# Patient Record
Sex: Female | Born: 1965 | Race: White | Hispanic: No | Marital: Married | Smoking: Former smoker
Health system: Southern US, Community
[De-identification: ages and names within clinical notes are randomized; demographics above are authoritative.]

## PROBLEM LIST (undated history)

## (undated) DIAGNOSIS — M1990 Unspecified osteoarthritis, unspecified site: Secondary | ICD-10-CM

## (undated) DIAGNOSIS — E785 Hyperlipidemia, unspecified: Secondary | ICD-10-CM

## (undated) DIAGNOSIS — T7840XA Allergy, unspecified, initial encounter: Secondary | ICD-10-CM

## (undated) DIAGNOSIS — I8390 Asymptomatic varicose veins of unspecified lower extremity: Secondary | ICD-10-CM

## (undated) DIAGNOSIS — K589 Irritable bowel syndrome without diarrhea: Secondary | ICD-10-CM

## (undated) DIAGNOSIS — D649 Anemia, unspecified: Secondary | ICD-10-CM

## (undated) DIAGNOSIS — K635 Polyp of colon: Secondary | ICD-10-CM

## (undated) DIAGNOSIS — B159 Hepatitis A without hepatic coma: Secondary | ICD-10-CM

## (undated) DIAGNOSIS — K219 Gastro-esophageal reflux disease without esophagitis: Secondary | ICD-10-CM

## (undated) DIAGNOSIS — K8020 Calculus of gallbladder without cholecystitis without obstruction: Secondary | ICD-10-CM

## (undated) DIAGNOSIS — K5900 Constipation, unspecified: Secondary | ICD-10-CM

## (undated) HISTORY — DX: Gastro-esophageal reflux disease without esophagitis: K21.9

## (undated) HISTORY — DX: Polyp of colon: K63.5

## (undated) HISTORY — DX: Calculus of gallbladder without cholecystitis without obstruction: K80.20

## (undated) HISTORY — PX: POLYPECTOMY: SHX149

## (undated) HISTORY — PX: BREAST LUMPECTOMY: SHX2

## (undated) HISTORY — DX: Unspecified osteoarthritis, unspecified site: M19.90

## (undated) HISTORY — DX: Irritable bowel syndrome, unspecified: K58.9

## (undated) HISTORY — PX: HYSTEROSCOPY: SHX211

## (undated) HISTORY — DX: Constipation, unspecified: K59.00

## (undated) HISTORY — DX: Hepatitis a without hepatic coma: B15.9

## (undated) HISTORY — DX: Hyperlipidemia, unspecified: E78.5

## (undated) HISTORY — PX: BREAST EXCISIONAL BIOPSY: SUR124

## (undated) HISTORY — DX: Allergy, unspecified, initial encounter: T78.40XA

## (undated) HISTORY — PX: CHOLECYSTECTOMY: SHX55

## (undated) HISTORY — PX: COLONOSCOPY: SHX174

## (undated) HISTORY — DX: Asymptomatic varicose veins of unspecified lower extremity: I83.90

## (undated) HISTORY — DX: Anemia, unspecified: D64.9

## (undated) HISTORY — PX: WISDOM TOOTH EXTRACTION: SHX21

## (undated) HISTORY — PX: APPENDECTOMY: SHX54

---

## 2012-12-15 HISTORY — PX: ESOPHAGOGASTRODUODENOSCOPY: SHX1529

## 2015-12-29 HISTORY — PX: BREAST CYST ASPIRATION: SHX578

## 2016-06-23 ENCOUNTER — Other Ambulatory Visit: Payer: Self-pay | Admitting: Obstetrics and Gynecology

## 2016-06-23 DIAGNOSIS — Z1231 Encounter for screening mammogram for malignant neoplasm of breast: Secondary | ICD-10-CM

## 2016-07-10 ENCOUNTER — Ambulatory Visit
Admission: RE | Admit: 2016-07-10 | Discharge: 2016-07-10 | Disposition: A | Discharge status: Home / Self Care | Payer: PRIVATE HEALTH INSURANCE | Source: Ambulatory Visit | Attending: Obstetrics and Gynecology | Admitting: Obstetrics and Gynecology

## 2016-07-10 DIAGNOSIS — Z1231 Encounter for screening mammogram for malignant neoplasm of breast: Secondary | ICD-10-CM

## 2016-07-14 ENCOUNTER — Other Ambulatory Visit: Payer: Self-pay | Admitting: Obstetrics and Gynecology

## 2016-07-14 DIAGNOSIS — R928 Other abnormal and inconclusive findings on diagnostic imaging of breast: Secondary | ICD-10-CM

## 2016-07-24 ENCOUNTER — Other Ambulatory Visit: Payer: Self-pay | Admitting: Obstetrics and Gynecology

## 2016-07-24 ENCOUNTER — Ambulatory Visit
Admission: RE | Admit: 2016-07-24 | Discharge: 2016-07-24 | Disposition: A | Discharge status: Home / Self Care | Payer: PRIVATE HEALTH INSURANCE | Source: Ambulatory Visit | Attending: Obstetrics and Gynecology | Admitting: Obstetrics and Gynecology

## 2016-07-24 DIAGNOSIS — R928 Other abnormal and inconclusive findings on diagnostic imaging of breast: Secondary | ICD-10-CM

## 2016-07-24 DIAGNOSIS — N6310 Unspecified lump in the right breast, unspecified quadrant: Secondary | ICD-10-CM

## 2016-07-27 ENCOUNTER — Other Ambulatory Visit: Payer: Self-pay | Admitting: Obstetrics and Gynecology

## 2016-07-27 ENCOUNTER — Ambulatory Visit
Admission: RE | Admit: 2016-07-27 | Discharge: 2016-07-27 | Disposition: A | Discharge status: Home / Self Care | Payer: PRIVATE HEALTH INSURANCE | Source: Ambulatory Visit | Attending: Obstetrics and Gynecology | Admitting: Obstetrics and Gynecology

## 2016-07-27 DIAGNOSIS — N6310 Unspecified lump in the right breast, unspecified quadrant: Secondary | ICD-10-CM

## 2017-06-22 ENCOUNTER — Other Ambulatory Visit: Payer: Self-pay | Admitting: Obstetrics and Gynecology

## 2017-06-22 DIAGNOSIS — Z1231 Encounter for screening mammogram for malignant neoplasm of breast: Secondary | ICD-10-CM

## 2017-07-16 ENCOUNTER — Ambulatory Visit
Admission: RE | Admit: 2017-07-16 | Discharge: 2017-07-16 | Disposition: A | Discharge status: Home / Self Care | Payer: PRIVATE HEALTH INSURANCE | Source: Ambulatory Visit | Attending: Obstetrics and Gynecology | Admitting: Obstetrics and Gynecology

## 2017-07-16 ENCOUNTER — Encounter (INDEPENDENT_AMBULATORY_CARE_PROVIDER_SITE_OTHER): Payer: Self-pay

## 2017-07-16 DIAGNOSIS — Z1231 Encounter for screening mammogram for malignant neoplasm of breast: Secondary | ICD-10-CM

## 2018-06-17 ENCOUNTER — Other Ambulatory Visit: Payer: Self-pay | Admitting: Obstetrics and Gynecology

## 2018-06-17 DIAGNOSIS — Z1231 Encounter for screening mammogram for malignant neoplasm of breast: Secondary | ICD-10-CM

## 2018-07-22 ENCOUNTER — Ambulatory Visit: Payer: PRIVATE HEALTH INSURANCE

## 2018-08-12 ENCOUNTER — Ambulatory Visit
Admission: RE | Admit: 2018-08-12 | Discharge: 2018-08-12 | Disposition: A | Discharge status: Home / Self Care | Payer: PRIVATE HEALTH INSURANCE | Source: Ambulatory Visit | Attending: Obstetrics and Gynecology | Admitting: Obstetrics and Gynecology

## 2018-08-12 DIAGNOSIS — Z1231 Encounter for screening mammogram for malignant neoplasm of breast: Secondary | ICD-10-CM

## 2018-12-20 ENCOUNTER — Encounter: Payer: Self-pay | Admitting: Gastroenterology

## 2019-07-21 ENCOUNTER — Other Ambulatory Visit: Payer: Self-pay | Admitting: Obstetrics and Gynecology

## 2019-07-21 DIAGNOSIS — Z1231 Encounter for screening mammogram for malignant neoplasm of breast: Secondary | ICD-10-CM

## 2019-08-10 ENCOUNTER — Ambulatory Visit (AMBULATORY_SURGERY_CENTER): Payer: Self-pay | Admitting: *Deleted

## 2019-08-10 ENCOUNTER — Other Ambulatory Visit: Payer: Self-pay

## 2019-08-10 VITALS — Temp 97.1°F | Ht 69.0 in | Wt 242.0 lb

## 2019-08-10 DIAGNOSIS — Z8371 Family history of colonic polyps: Secondary | ICD-10-CM

## 2019-08-10 MED ORDER — SUPREP BOWEL PREP KIT 17.5-3.13-1.6 GM/177ML PO SOLN: 1.0000 | Freq: Once | ORAL | 0 refills | Status: AC

## 2019-08-10 NOTE — Progress Notes (Signed)
No egg or soy allergy known to patient  No issues with past sedation with any surgeries  or procedures, no intubation problems  No diet pills per patient No home 02 use per patient  No blood thinners per patient  Pt states  issues with constipation- is better- uses Miralax prn- has aBM  Every other day- no hard like used to be but always has a fair prep  - drinks a lot of water - Off Amitiza- WILL DO A 2 DAY PREP  No A fib or A flutter  EMMI video sent to pt's e mail  Pt states Dr Lyndel Safe always tells her she  Has a fair prep

## 2019-08-18 ENCOUNTER — Encounter: Payer: Self-pay | Admitting: Gastroenterology

## 2019-08-21 ENCOUNTER — Telehealth: Payer: Self-pay

## 2019-08-21 NOTE — Telephone Encounter (Signed)
Covid-19 screening questions   Do you now or have you had a fever in the last 14 days? NO   Do you have any respiratory symptoms of shortness of breath or cough now or in the last 14 days? NO  Do you have any family members or close contacts with diagnosed or suspected Covid-19 in the past 14 days? NO  Have you been tested for Covid-19 and found to be positive? NO        

## 2019-08-22 ENCOUNTER — Encounter: Payer: Self-pay | Admitting: Gastroenterology

## 2019-08-22 ENCOUNTER — Other Ambulatory Visit: Payer: Self-pay

## 2019-08-22 ENCOUNTER — Ambulatory Visit (AMBULATORY_SURGERY_CENTER): Payer: 59 | Admitting: Gastroenterology

## 2019-08-22 VITALS — BP 102/77 | HR 69 | Temp 98.3°F | Resp 14 | Ht 69.0 in | Wt 242.0 lb

## 2019-08-22 DIAGNOSIS — D122 Benign neoplasm of ascending colon: Secondary | ICD-10-CM | POA: Diagnosis not present

## 2019-08-22 DIAGNOSIS — Z1211 Encounter for screening for malignant neoplasm of colon: Secondary | ICD-10-CM | POA: Diagnosis not present

## 2019-08-22 DIAGNOSIS — Z8371 Family history of colonic polyps: Secondary | ICD-10-CM

## 2019-08-22 MED ORDER — SODIUM CHLORIDE 0.9 % IV SOLN: 500.0000 mL | Freq: Once | INTRAVENOUS | Status: DC

## 2019-08-22 NOTE — Progress Notes (Signed)
To PACU, VSS. Report to [email or web address] 

## 2019-08-22 NOTE — Op Note (Signed)
[facility] Patient Name: Erica Stephens Procedure Date: 08/22/2019 9:13 AM MRN: [id number] Endoscopist: Jackquline Denmark , MD Age: 53 Referring MD:  Date of Birth: 11-21-1966 Gender: Female Account #: [id number] Procedure:                Colonoscopy Indications:              Screening for colorectal malignant neoplasm (high                            risk). Family history of colonic polyps (mom).                            Family history of colon cancer in a second-degree                            relative (grand mother) Medicines:                Monitored Anesthesia Care Procedure:                Pre-Anesthesia Assessment:                           - Prior to the procedure, a History and Physical                            was performed, and patient medications and                            allergies were reviewed. The patient's tolerance of                            previous anesthesia was also reviewed. The risks                            and benefits of the procedure and the sedation                            options and risks were discussed with the patient.                            All questions were answered, and informed consent                            was obtained. Prior Anticoagulants: The patient has                            taken no previous anticoagulant or antiplatelet                            agents. ASA Grade Assessment: I - A normal, healthy                            patient. After reviewing the risks and benefits,  the patient was deemed in satisfactory condition to                            undergo the procedure.                           After obtaining informed consent, the colonoscope                            was passed under direct vision. Throughout the                            procedure, the patient's blood pressure, pulse, and                            oxygen saturations were monitored continuously. The                           Colonoscope was introduced through the anus and                            advanced to the 2 cm into the ileum. The                            colonoscopy was performed without difficulty. The                            patient tolerated the procedure well. The quality                            of the bowel preparation was excellent. The                            terminal ileum, ileocecal valve, appendiceal                            orifice, and rectum were photographed. Scope In: 9:24:49 AM Scope Out: 9:40:05 AM Scope Withdrawal Time: 0 hours 9 minutes 11 seconds  Total Procedure Duration: 0 hours 15 minutes 16 seconds  Findings:                 A 4 mm polyp was found in the mid ascending colon.                            The polyp was sessile. The polyp was removed with a                            cold biopsy forceps. Resection and retrieval were                            complete. Estimated blood loss: none.                           The terminal ileum appeared normal.  The exam was otherwise without abnormality on                            direct and retroflexion views. Complications:            No immediate complications. Estimated Blood Loss:     Estimated blood loss: none. Impression:               -Small colonic polyp s/p polypectomy.                           -Otherwise normal colonoscopy to TI. Recommendation:           - Patient has a contact number available for                            emergencies. The signs and symptoms of potential                            delayed complications were discussed with the                            patient. Return to normal activities tomorrow.                            Written discharge instructions were provided to the                            patient.                           - Resume previous diet.                           - Continue present medications.                            - Await pathology results.                           - Repeat colonoscopy for surveillance based on                            pathology results.                           - Return to GI clinic PRN.                           - D/w Richard (pt's husband). Jackquline Denmark, MD 08/22/2019 9:48:42 AM This report has been signed electronically.

## 2019-08-22 NOTE — Progress Notes (Signed)
Temps by MB and vitals by CW

## 2019-08-22 NOTE — Patient Instructions (Signed)
Read all handouts given to you by your recovery room nurse.  Thank-you for choosing us for your healthcare needs today.  YOU HAD AN ENDOSCOPIC PROCEDURE TODAY AT THE [facility]:   Refer to the procedure report that was given to you for any specific questions about what was found during the examination.  If the procedure report does not answer your questions, please call your gastroenterologist to clarify.  If you requested that your care partner not be given the details of your procedure findings, then the procedure report has been included in a sealed envelope for you to review at your convenience later.  YOU SHOULD EXPECT: Some feelings of bloating in the abdomen. Passage of more gas than usual.  Walking can help get rid of the air that was put into your GI tract during the procedure and reduce the bloating. If you had a lower endoscopy (such as a colonoscopy or flexible sigmoidoscopy) you may notice spotting of blood in your stool or on the toilet paper. If you underwent a bowel prep for your procedure, you may not have a normal bowel movement for a few days.  Please Note:  You might notice some irritation and congestion in your nose or some drainage.  This is from the oxygen used during your procedure.  There is no need for concern and it should clear up in a day or so.  SYMPTOMS TO REPORT IMMEDIATELY:   Following lower endoscopy (colonoscopy or flexible sigmoidoscopy):  Excessive amounts of blood in the stool  Significant tenderness or worsening of abdominal pains  Swelling of the abdomen that is new, acute  Fever of 100F or higher   For urgent or emergent issues, a gastroenterologist can be reached at any hour by calling [phone].   DIET:  We do recommend a small meal at first, but then you may proceed to your regular diet.  Drink plenty of fluids but you should avoid alcoholic beverages for 24 hours.  ACTIVITY:  You should plan to take it easy for the rest of today  and you should NOT DRIVE or use heavy machinery until tomorrow (because of the sedation medicines used during the test).    FOLLOW UP: Our staff will call the number listed on your records 48-72 hours following your procedure to check on you and address any questions or concerns that you may have regarding the information given to you following your procedure. If we do not reach you, we will leave a message.  We will attempt to reach you two times.  During this call, we will ask if you have developed any symptoms of COVID 19. If you develop any symptoms (ie: fever, flu-like symptoms, shortness of breath, cough etc.) before then, please call [phone].  If you test positive for Covid 19 in the 2 weeks post procedure, please call and report this information to us.    If any biopsies were taken you will be contacted by phone or by letter within the next 1-3 weeks.  Please call us at [phone] if you have not heard about the biopsies in 3 weeks.    SIGNATURES/CONFIDENTIALITY: You and/or your care partner have signed paperwork which will be entered into your electronic medical record.  These signatures attest to the fact that that the information above on your After Visit Summary has been reviewed and is understood.  Full responsibility of the confidentiality of this discharge information lies with you and/or your care-partner. 

## 2019-08-24 ENCOUNTER — Telehealth: Payer: Self-pay

## 2019-08-24 NOTE — Telephone Encounter (Signed)
  Follow up Call-  Call back number 08/22/2019  Post procedure Call Back phone  # [phone]  Permission to leave phone message Yes  Some recent data might be hidden     Patient questions:  Do you have a fever, pain , or abdominal swelling? No. Pain Score  0 *  Have you tolerated food without any problems? Yes.    Have you been able to return to your normal activities? Yes.    Do you have any questions about your discharge instructions: Diet   No. Medications  No. Follow up visit  No.  Do you have questions or concerns about your Care? No.  Actions: * If pain score is 4 or above: No action needed, pain <4. 1. Have you developed a fever since your procedure? no  2.   Have you had an respiratory symptoms (SOB or cough) since your procedure? no  3.   Have you tested positive for COVID 19 since your procedure no  4.   Have you had any family members/close contacts diagnosed with the COVID 19 since your procedure?  no   If yes to any of these questions please route to Joylene John, RN and Alphonsa Gin, Therapist, sports.

## 2019-08-27 ENCOUNTER — Encounter: Payer: Self-pay | Admitting: Gastroenterology

## 2019-09-08 ENCOUNTER — Other Ambulatory Visit: Payer: Self-pay

## 2019-09-08 ENCOUNTER — Ambulatory Visit
Admission: RE | Admit: 2019-09-08 | Discharge: 2019-09-08 | Disposition: A | Discharge status: Home / Self Care | Payer: PRIVATE HEALTH INSURANCE | Source: Ambulatory Visit | Attending: Obstetrics and Gynecology | Admitting: Obstetrics and Gynecology

## 2019-09-08 DIAGNOSIS — Z1231 Encounter for screening mammogram for malignant neoplasm of breast: Secondary | ICD-10-CM

## 2020-07-03 ENCOUNTER — Other Ambulatory Visit: Payer: Self-pay | Admitting: Obstetrics and Gynecology

## 2020-07-03 DIAGNOSIS — Z1231 Encounter for screening mammogram for malignant neoplasm of breast: Secondary | ICD-10-CM

## 2020-09-13 ENCOUNTER — Other Ambulatory Visit: Payer: Self-pay

## 2020-09-13 ENCOUNTER — Ambulatory Visit
Admission: RE | Admit: 2020-09-13 | Discharge: 2020-09-13 | Disposition: A | Discharge status: Home / Self Care | Payer: 59 | Source: Ambulatory Visit | Attending: Obstetrics and Gynecology | Admitting: Obstetrics and Gynecology

## 2020-09-13 DIAGNOSIS — Z1231 Encounter for screening mammogram for malignant neoplasm of breast: Secondary | ICD-10-CM

## 2020-09-17 ENCOUNTER — Other Ambulatory Visit: Payer: Self-pay | Admitting: Obstetrics and Gynecology

## 2020-09-17 DIAGNOSIS — R928 Other abnormal and inconclusive findings on diagnostic imaging of breast: Secondary | ICD-10-CM

## 2020-09-23 ENCOUNTER — Other Ambulatory Visit: Payer: Self-pay

## 2020-09-23 ENCOUNTER — Ambulatory Visit
Admission: RE | Admit: 2020-09-23 | Discharge: 2020-09-23 | Disposition: A | Discharge status: Home / Self Care | Payer: 59 | Source: Ambulatory Visit | Attending: Obstetrics and Gynecology | Admitting: Obstetrics and Gynecology

## 2020-09-23 ENCOUNTER — Other Ambulatory Visit: Payer: Self-pay | Admitting: Obstetrics and Gynecology

## 2020-09-23 DIAGNOSIS — R921 Mammographic calcification found on diagnostic imaging of breast: Secondary | ICD-10-CM

## 2020-09-23 DIAGNOSIS — R928 Other abnormal and inconclusive findings on diagnostic imaging of breast: Secondary | ICD-10-CM

## 2020-09-30 ENCOUNTER — Ambulatory Visit
Admission: RE | Admit: 2020-09-30 | Discharge: 2020-09-30 | Disposition: A | Discharge status: Home / Self Care | Payer: 59 | Source: Ambulatory Visit | Attending: Obstetrics and Gynecology | Admitting: Obstetrics and Gynecology

## 2020-09-30 ENCOUNTER — Other Ambulatory Visit: Payer: Self-pay

## 2020-09-30 DIAGNOSIS — R921 Mammographic calcification found on diagnostic imaging of breast: Secondary | ICD-10-CM

## 2020-10-01 ENCOUNTER — Other Ambulatory Visit: Payer: Self-pay | Admitting: Obstetrics and Gynecology

## 2020-10-01 DIAGNOSIS — D241 Benign neoplasm of right breast: Secondary | ICD-10-CM

## 2020-10-11 ENCOUNTER — Ambulatory Visit
Admission: RE | Admit: 2020-10-11 | Discharge: 2020-10-11 | Disposition: A | Discharge status: Home / Self Care | Payer: 59 | Source: Ambulatory Visit | Attending: Obstetrics and Gynecology | Admitting: Obstetrics and Gynecology

## 2020-10-11 ENCOUNTER — Other Ambulatory Visit: Payer: Self-pay

## 2020-10-11 DIAGNOSIS — D241 Benign neoplasm of right breast: Secondary | ICD-10-CM

## 2021-08-06 ENCOUNTER — Other Ambulatory Visit: Payer: Self-pay | Admitting: Obstetrics and Gynecology

## 2021-08-06 DIAGNOSIS — Z1231 Encounter for screening mammogram for malignant neoplasm of breast: Secondary | ICD-10-CM

## 2021-09-26 ENCOUNTER — Ambulatory Visit
Admission: RE | Admit: 2021-09-26 | Discharge: 2021-09-26 | Disposition: A | Discharge status: Home / Self Care | Payer: 59 | Source: Ambulatory Visit | Attending: Obstetrics and Gynecology | Admitting: Obstetrics and Gynecology

## 2021-09-26 ENCOUNTER — Other Ambulatory Visit: Payer: Self-pay

## 2021-09-26 DIAGNOSIS — Z1231 Encounter for screening mammogram for malignant neoplasm of breast: Secondary | ICD-10-CM

## 2021-10-03 ENCOUNTER — Other Ambulatory Visit: Payer: Self-pay | Admitting: Obstetrics and Gynecology

## 2021-10-03 DIAGNOSIS — R928 Other abnormal and inconclusive findings on diagnostic imaging of breast: Secondary | ICD-10-CM

## 2021-10-23 ENCOUNTER — Other Ambulatory Visit: Payer: 59

## 2021-10-24 ENCOUNTER — Other Ambulatory Visit: Payer: Self-pay

## 2021-10-24 ENCOUNTER — Ambulatory Visit
Admission: RE | Admit: 2021-10-24 | Discharge: 2021-10-24 | Disposition: A | Discharge status: Home / Self Care | Payer: 59 | Source: Ambulatory Visit | Attending: Obstetrics and Gynecology | Admitting: Obstetrics and Gynecology

## 2021-10-24 DIAGNOSIS — R928 Other abnormal and inconclusive findings on diagnostic imaging of breast: Secondary | ICD-10-CM

## 2022-08-12 ENCOUNTER — Other Ambulatory Visit: Payer: Self-pay | Admitting: Obstetrics and Gynecology

## 2022-08-12 DIAGNOSIS — Z1231 Encounter for screening mammogram for malignant neoplasm of breast: Secondary | ICD-10-CM

## 2022-10-02 DIAGNOSIS — Z1231 Encounter for screening mammogram for malignant neoplasm of breast: Secondary | ICD-10-CM

## 2022-11-26 ENCOUNTER — Ambulatory Visit
Admission: RE | Admit: 2022-11-26 | Discharge: 2022-11-26 | Disposition: A | Discharge status: Home / Self Care | Payer: 59 | Source: Ambulatory Visit | Attending: Obstetrics and Gynecology | Admitting: Obstetrics and Gynecology

## 2022-11-26 DIAGNOSIS — Z1231 Encounter for screening mammogram for malignant neoplasm of breast: Secondary | ICD-10-CM

## 2022-11-30 ENCOUNTER — Other Ambulatory Visit: Payer: Self-pay | Admitting: Obstetrics and Gynecology

## 2022-11-30 DIAGNOSIS — R928 Other abnormal and inconclusive findings on diagnostic imaging of breast: Secondary | ICD-10-CM

## 2022-12-10 ENCOUNTER — Other Ambulatory Visit: Payer: 59

## 2023-01-01 ENCOUNTER — Ambulatory Visit
Admission: RE | Admit: 2023-01-01 | Discharge: 2023-01-01 | Disposition: A | Discharge status: Home / Self Care | Payer: 59 | Source: Ambulatory Visit | Attending: Obstetrics and Gynecology | Admitting: Obstetrics and Gynecology

## 2023-01-01 DIAGNOSIS — R928 Other abnormal and inconclusive findings on diagnostic imaging of breast: Secondary | ICD-10-CM

## 2023-01-04 ENCOUNTER — Other Ambulatory Visit: Payer: Self-pay | Admitting: Obstetrics and Gynecology

## 2023-01-04 DIAGNOSIS — R928 Other abnormal and inconclusive findings on diagnostic imaging of breast: Secondary | ICD-10-CM

## 2023-01-06 ENCOUNTER — Other Ambulatory Visit: Payer: Self-pay | Admitting: Internal Medicine

## 2023-01-06 DIAGNOSIS — R928 Other abnormal and inconclusive findings on diagnostic imaging of breast: Secondary | ICD-10-CM

## 2023-01-08 ENCOUNTER — Ambulatory Visit
Admission: RE | Admit: 2023-01-08 | Discharge: 2023-01-08 | Disposition: A | Discharge status: Home / Self Care | Payer: 59 | Source: Ambulatory Visit | Attending: Internal Medicine | Admitting: Internal Medicine

## 2023-01-08 ENCOUNTER — Other Ambulatory Visit: Payer: 59

## 2023-01-08 DIAGNOSIS — R928 Other abnormal and inconclusive findings on diagnostic imaging of breast: Secondary | ICD-10-CM

## 2023-01-08 HISTORY — PX: BREAST BIOPSY: SHX20

## 2023-01-19 ENCOUNTER — Other Ambulatory Visit: Payer: Self-pay | Admitting: Surgery

## 2023-01-19 DIAGNOSIS — N6452 Nipple discharge: Secondary | ICD-10-CM

## 2023-02-04 ENCOUNTER — Other Ambulatory Visit: Payer: 59

## 2023-02-23 ENCOUNTER — Ambulatory Visit
Admission: RE | Admit: 2023-02-23 | Discharge: 2023-02-23 | Disposition: A | Discharge status: Home / Self Care | Payer: 59 | Source: Ambulatory Visit | Attending: Surgery | Admitting: Surgery

## 2023-02-23 DIAGNOSIS — N6452 Nipple discharge: Secondary | ICD-10-CM

## 2023-02-23 MED ORDER — GADOPICLENOL 0.5 MMOL/ML IV SOLN
10.0000 mL | Freq: Once | INTRAVENOUS | Status: AC | PRN
  Administered 2023-02-23: 10 mL via INTRAVENOUS

## 2023-02-24 ENCOUNTER — Other Ambulatory Visit: Payer: Self-pay | Admitting: Surgery

## 2023-02-24 DIAGNOSIS — R928 Other abnormal and inconclusive findings on diagnostic imaging of breast: Secondary | ICD-10-CM

## 2023-03-05 ENCOUNTER — Other Ambulatory Visit: Payer: 59

## 2023-03-05 ENCOUNTER — Ambulatory Visit
Admission: RE | Admit: 2023-03-05 | Discharge: 2023-03-05 | Disposition: A | Discharge status: Home / Self Care | Payer: 59 | Source: Ambulatory Visit | Attending: Surgery | Admitting: Surgery

## 2023-03-05 DIAGNOSIS — R928 Other abnormal and inconclusive findings on diagnostic imaging of breast: Secondary | ICD-10-CM

## 2023-03-05 MED ORDER — GADOPICLENOL 0.5 MMOL/ML IV SOLN
10.0000 mL | Freq: Once | INTRAVENOUS | Status: AC | PRN
  Administered 2023-03-05: 10 mL via INTRAVENOUS

## 2023-03-22 ENCOUNTER — Encounter: Payer: Self-pay | Admitting: Gastroenterology

## 2023-04-27 IMAGING — MG MM DIGITAL DIAGNOSTIC UNILAT*R* W/ TOMO W/ CAD
4 series · 4 of 12 positions shown · non-contrast
Comparison: Previous exam(s).

CLINICAL DATA: Screening recall for a possible right breast mass.
Since the screening mammogram from August 2020, a papilloma has
been excised from the lateral right breast.

EXAM:
DIGITAL DIAGNOSTIC UNILATERAL RIGHT MAMMOGRAM WITH TOMOSYNTHESIS AND
CAD; ULTRASOUND RIGHT BREAST LIMITED
TECHNIQUE: Right digital diagnostic mammography and breast tomosynthesis was
performed. The images were evaluated with computer-aided detection.;
Targeted ultrasound examination of the right breast was performed

[R CC synth-2D]
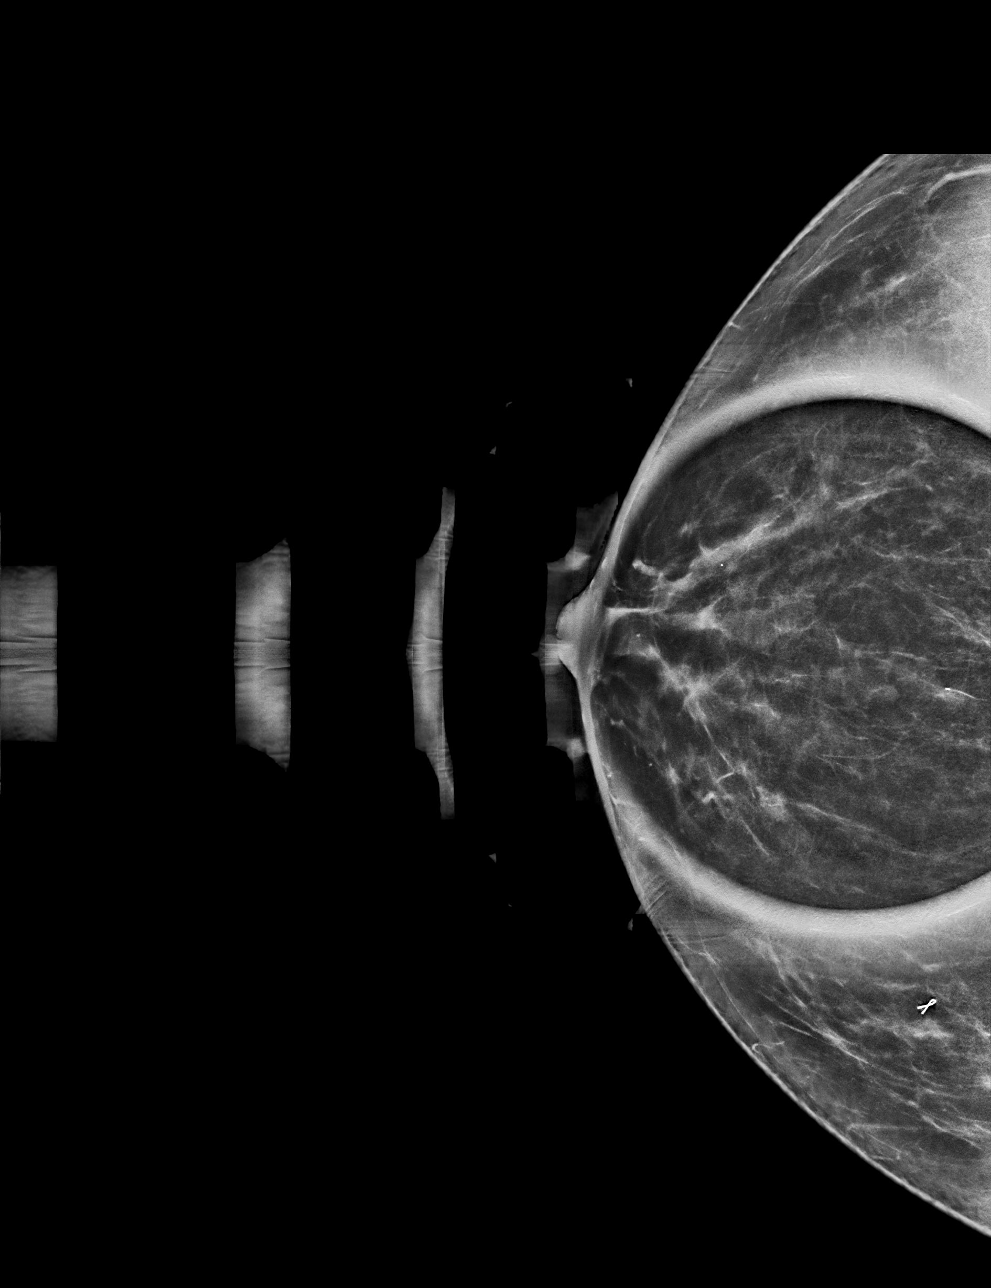

[R MLO synth-2D]
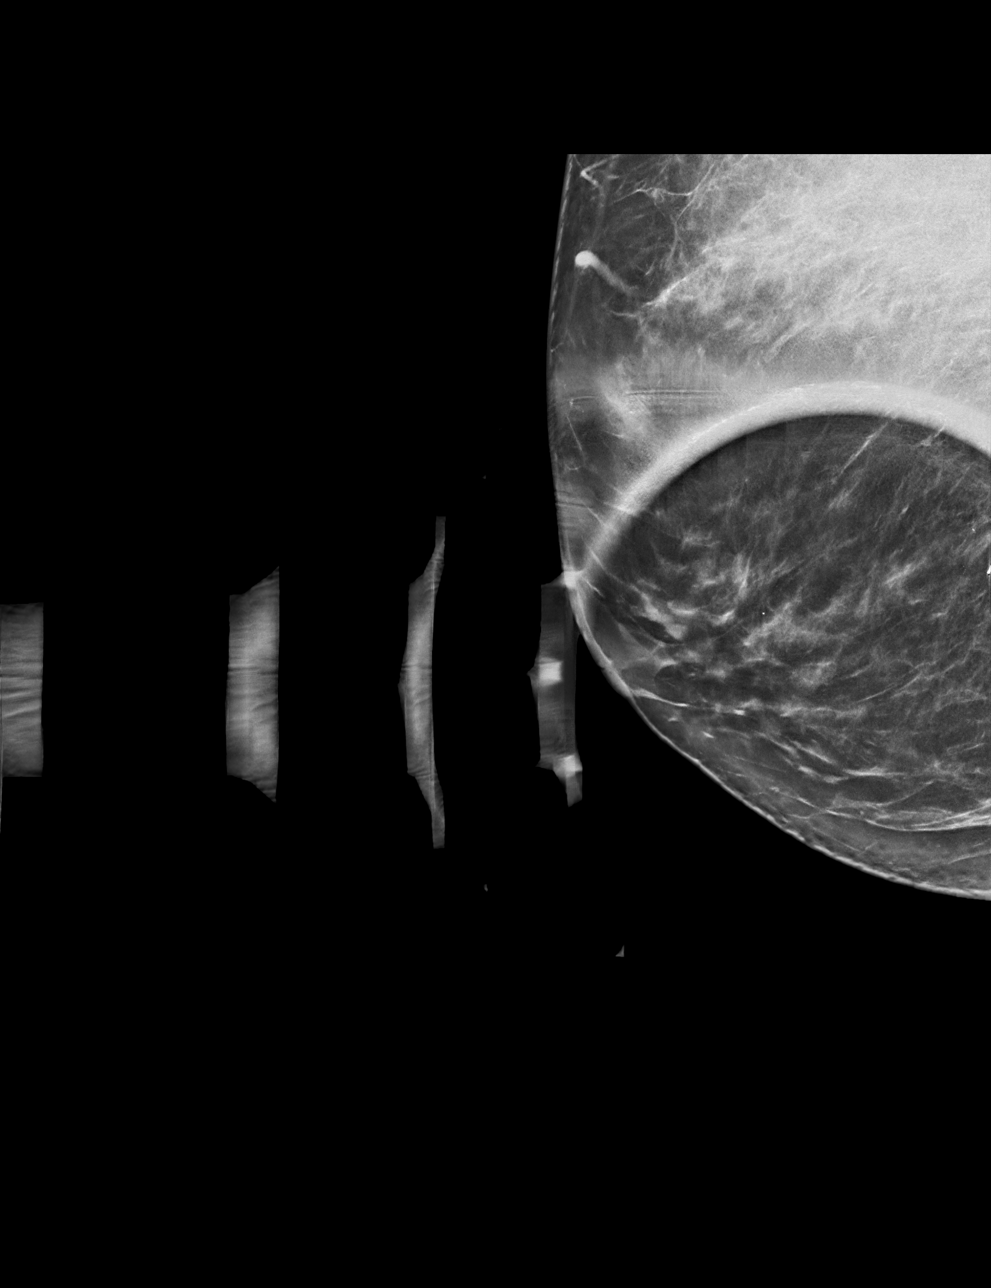

[R MLO tomo · tomo slice 37/73.0]
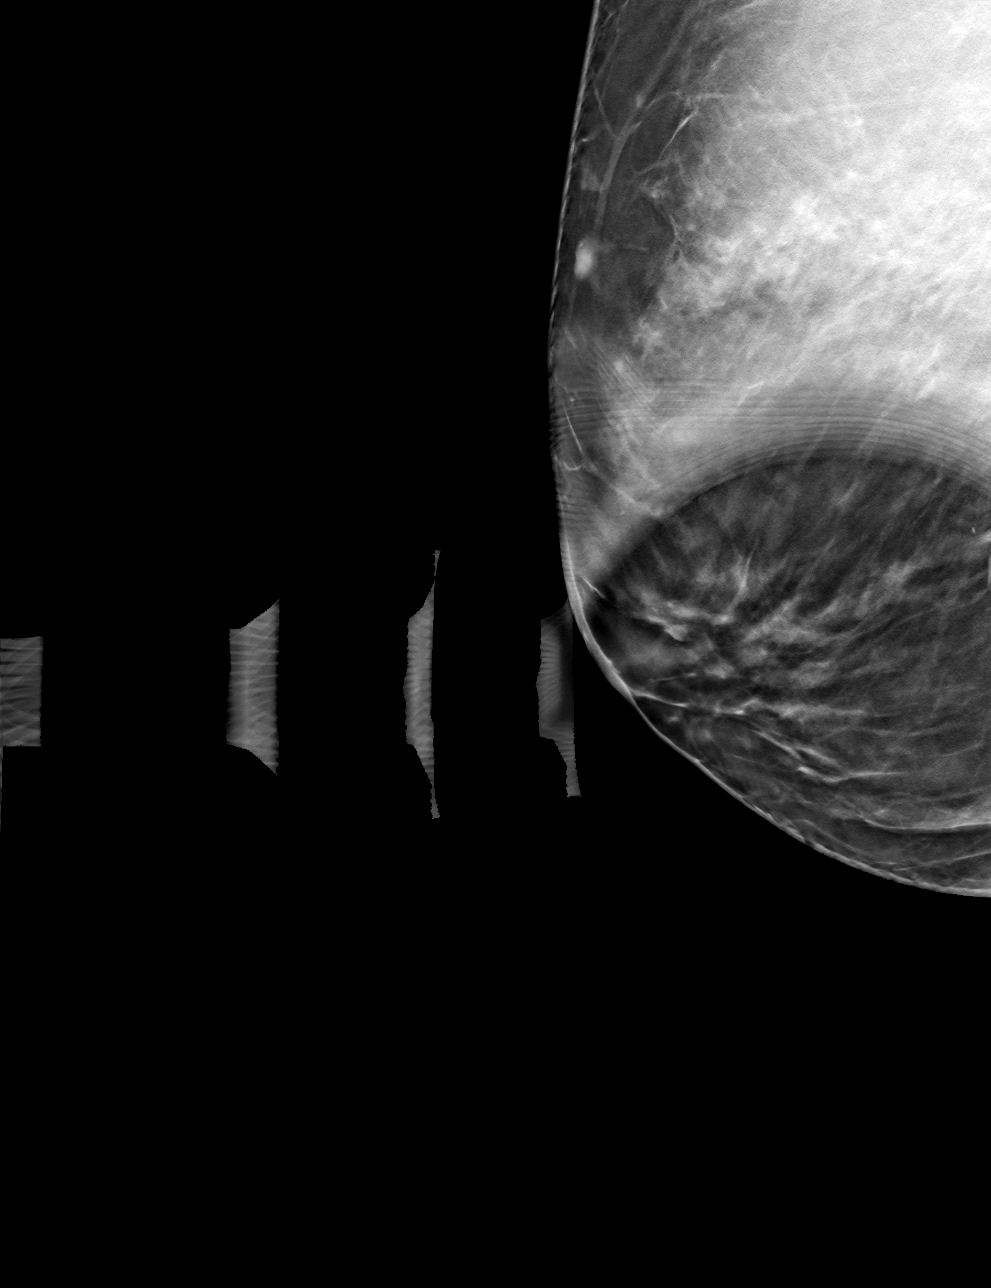

[R CC tomo · tomo slice 35/68.0]
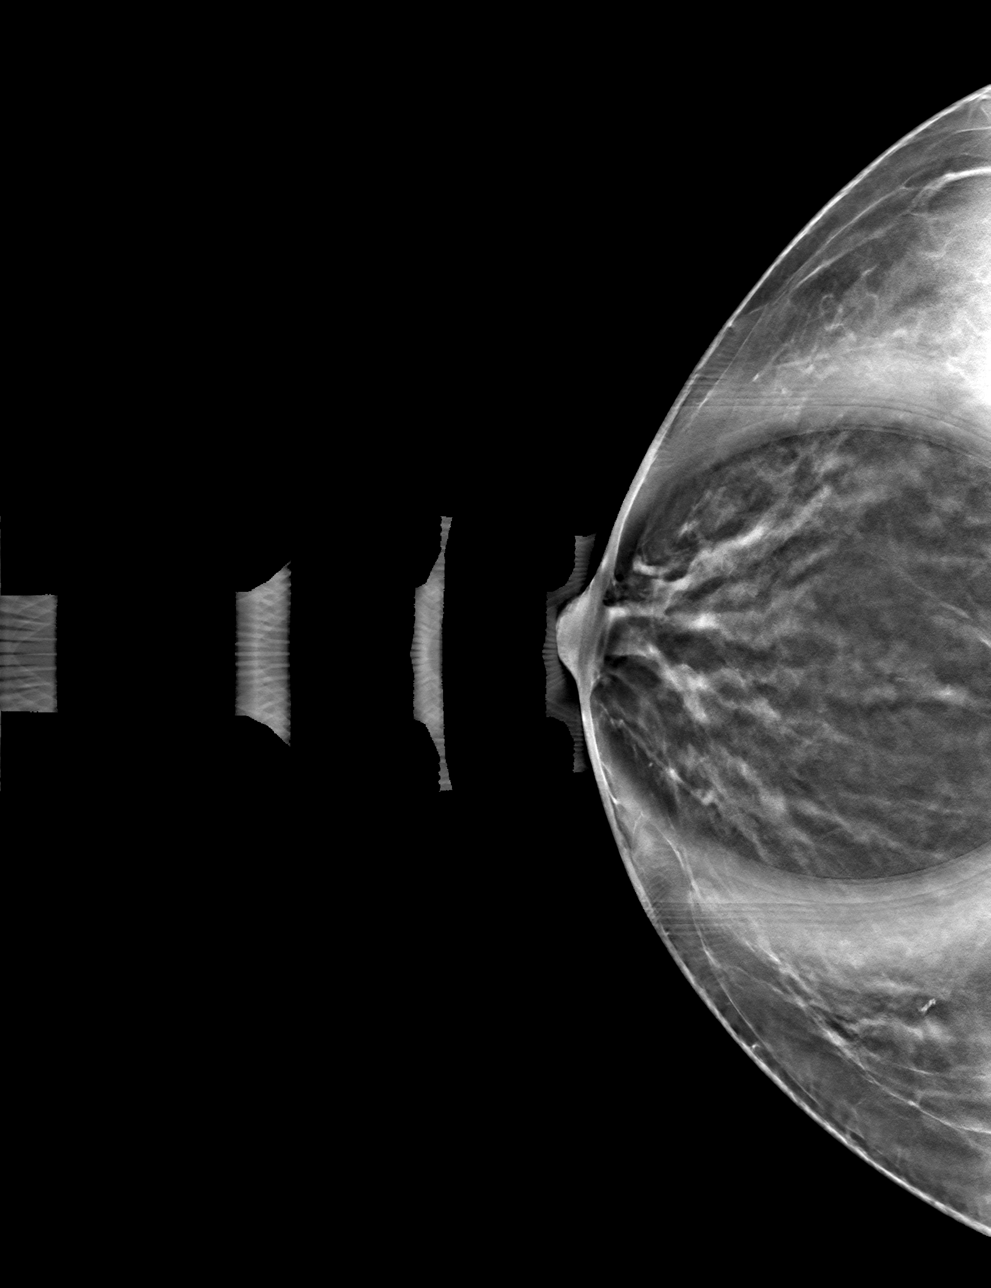

[4 of 12 positions shown; findings below may reference images not displayed]

ACR Breast Density Category b: There are scattered areas of
fibroglandular density.
FINDINGS: The possible mass, noted in the retroareolar breast on the current
screening study, persists as oval to tubular mostly circumscribed
structure in the 6 o'clock retroareolar breast measuring
approximately 9 mm in long axis. There are no other masses and no
areas of nonsurgical architectural distortion. There are no
suspicious calcifications.

Targeted right breast ultrasound is performed, showing a focally
dilated duct in the right breast at 6 o'clock, retroareolar,
measuring 9 x 4 x 5 mm, consistent in size, shape and location to
the mammographic mass. There is no intraductal mass or debris.
IMPRESSION: 1. No evidence of breast malignancy.
2. Benign focally dilated duct in the 6 o'clock retroareolar right
breast. No intraductal mass or tissue/debris.

RECOMMENDATION:
Screening mammogram in one year.(Code:8Y-0-PPP)

I have discussed the findings and recommendations with the patient.
If applicable, a reminder letter will be sent to the patient
regarding the next appointment.

BI-RADS CATEGORY  2: Benign.

## 2023-04-27 IMAGING — US US BREAST*R* LIMITED INC AXILLA
1 series · 8 of 8 positions shown · non-contrast
Comparison: Previous exam(s).

CLINICAL DATA: Screening recall for a possible right breast mass.
Since the screening mammogram from August 2020, a papilloma has
been excised from the lateral right breast.

EXAM:
DIGITAL DIAGNOSTIC UNILATERAL RIGHT MAMMOGRAM WITH TOMOSYNTHESIS AND
CAD; ULTRASOUND RIGHT BREAST LIMITED
TECHNIQUE: Right digital diagnostic mammography and breast tomosynthesis was
performed. The images were evaluated with computer-aided detection.;
Targeted ultrasound examination of the right breast was performed

[Series 1: us breast*right* limited inc axilla · 0.06mm/px · 8 of 8 slices shown]
[im 1/8]
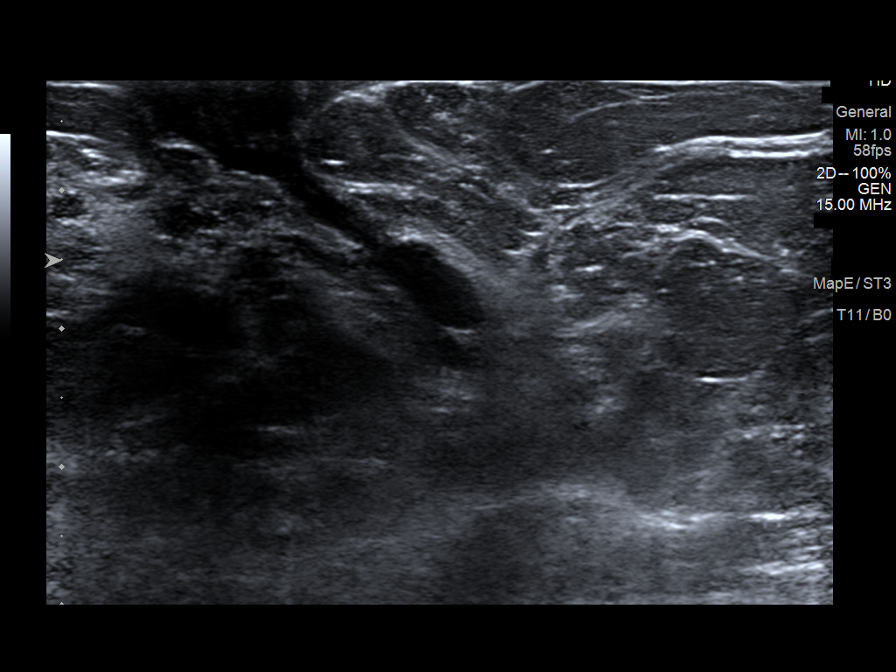
[im 2/8]
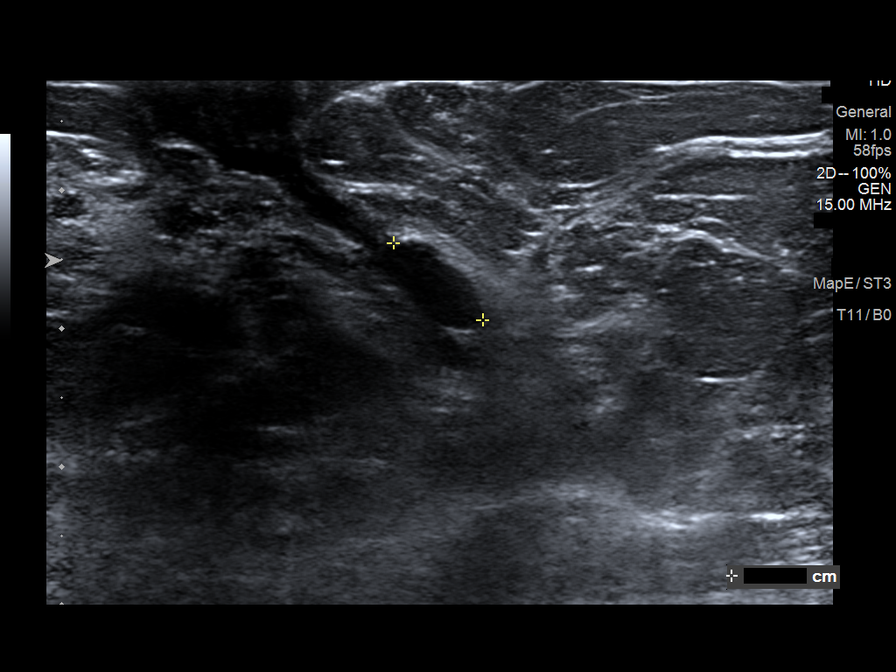
[im 3/8]
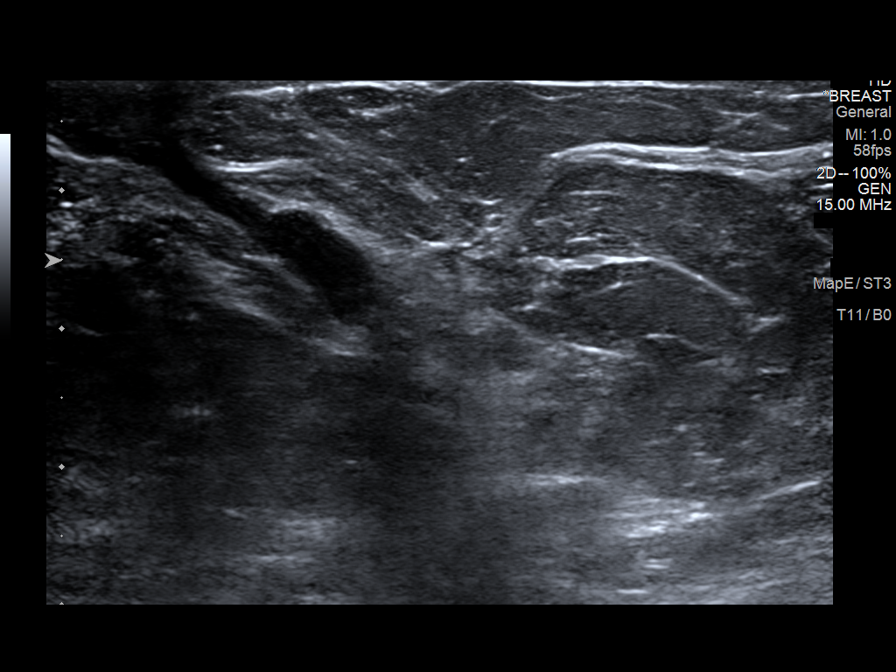
[im 4/8]
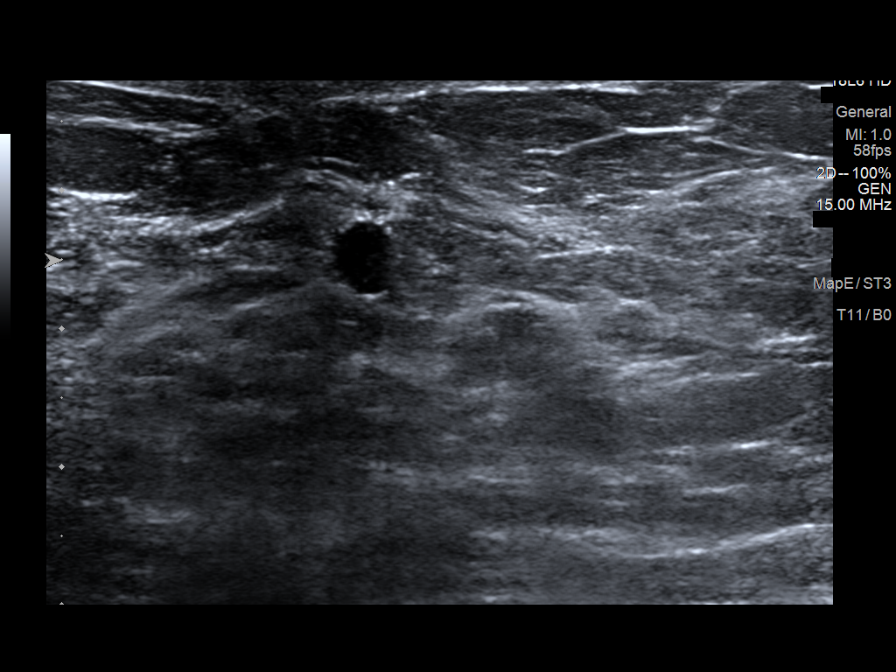
[im 5/8]
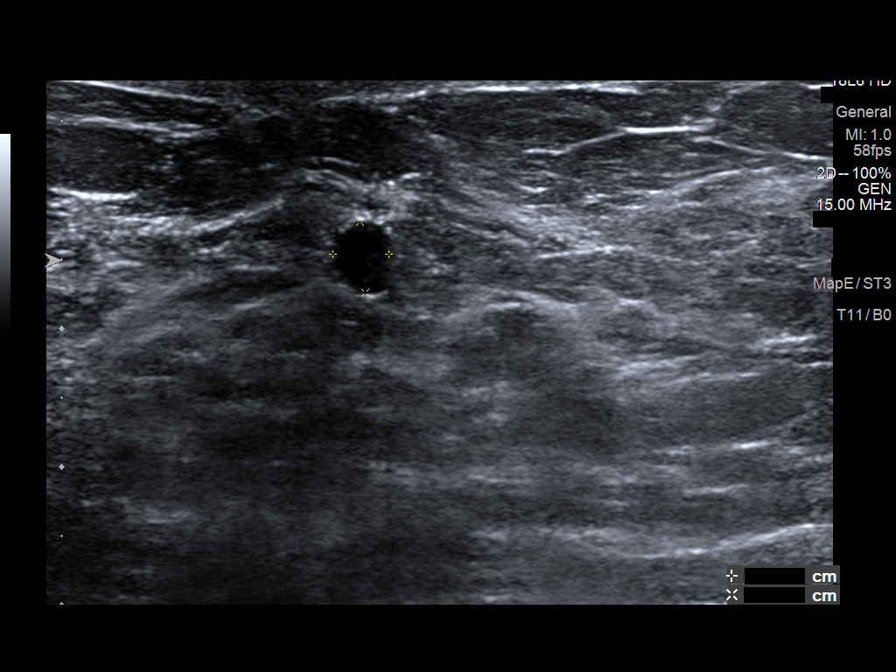
[im 6/8]
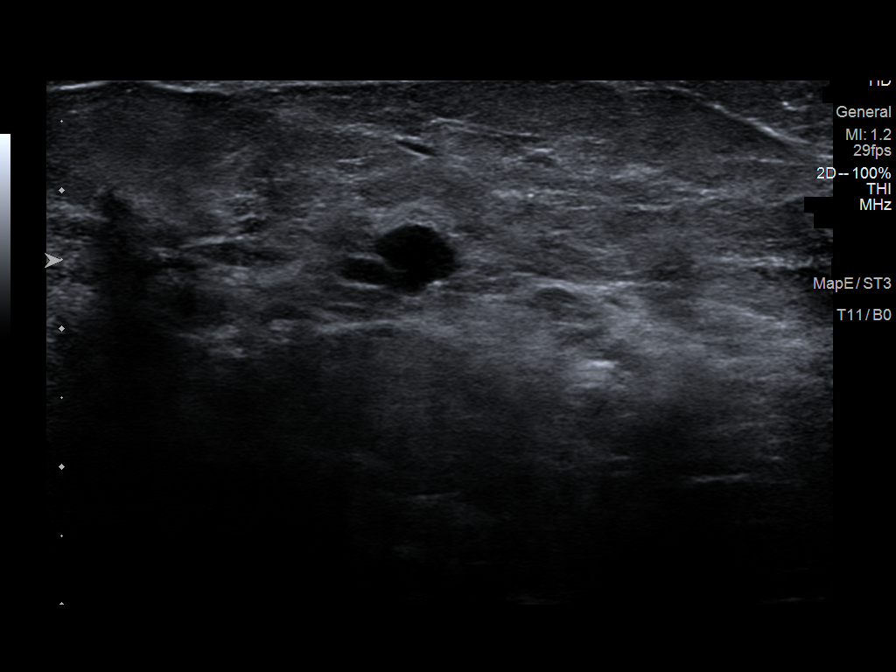
[im 7/8]
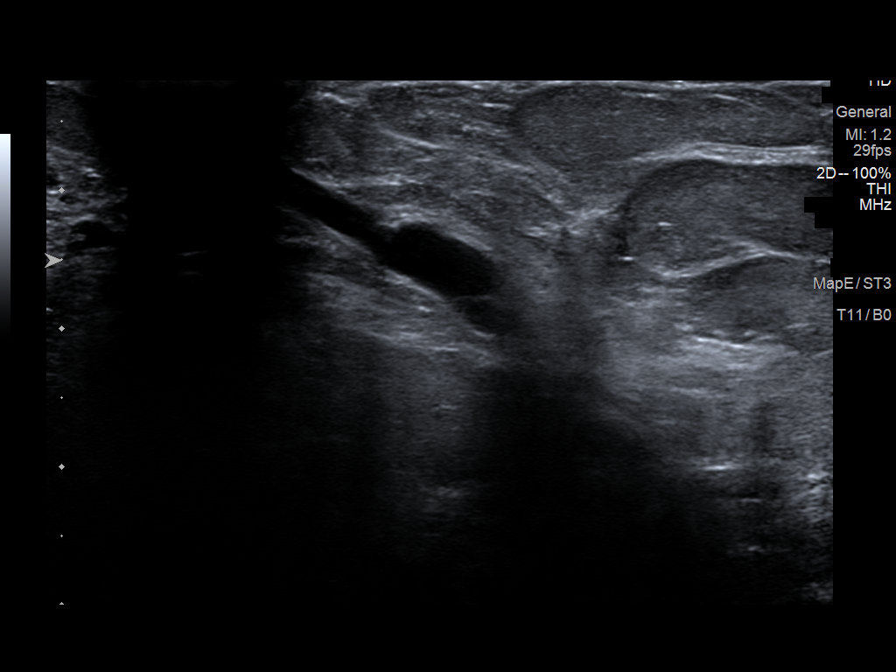
[im 8/8]
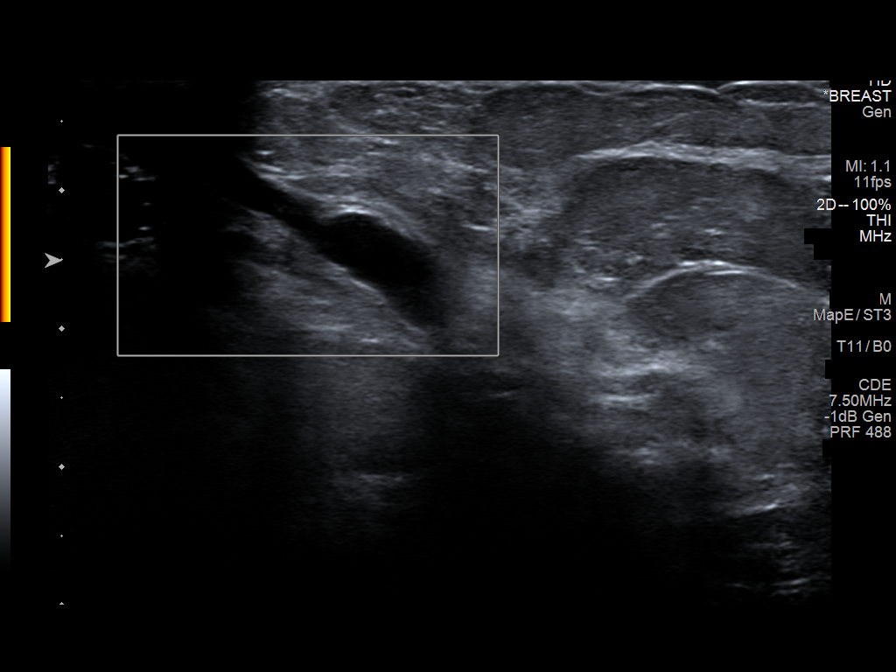

[8 of 8 positions shown; findings below may reference images not displayed]

ACR Breast Density Category b: There are scattered areas of
fibroglandular density.
FINDINGS: The possible mass, noted in the retroareolar breast on the current
screening study, persists as oval to tubular mostly circumscribed
structure in the 6 o'clock retroareolar breast measuring
approximately 9 mm in long axis. There are no other masses and no
areas of nonsurgical architectural distortion. There are no
suspicious calcifications.

Targeted right breast ultrasound is performed, showing a focally
dilated duct in the right breast at 6 o'clock, retroareolar,
measuring 9 x 4 x 5 mm, consistent in size, shape and location to
the mammographic mass. There is no intraductal mass or debris.
IMPRESSION: 1. No evidence of breast malignancy.
2. Benign focally dilated duct in the 6 o'clock retroareolar right
breast. No intraductal mass or tissue/debris.

RECOMMENDATION:
Screening mammogram in one year.(Code:8Y-0-PPP)

I have discussed the findings and recommendations with the patient.
If applicable, a reminder letter will be sent to the patient
regarding the next appointment.

BI-RADS CATEGORY  2: Benign.

## 2023-04-30 ENCOUNTER — Ambulatory Visit: Payer: 59 | Admitting: Nurse Practitioner

## 2023-06-18 ENCOUNTER — Ambulatory Visit (INDEPENDENT_AMBULATORY_CARE_PROVIDER_SITE_OTHER): Payer: 59 | Admitting: Gastroenterology

## 2023-06-18 ENCOUNTER — Encounter: Payer: Self-pay | Admitting: Gastroenterology

## 2023-06-18 VITALS — BP 110/70 | HR 72 | Ht 68.0 in | Wt 236.0 lb

## 2023-06-18 DIAGNOSIS — Z86010 Personal history of colonic polyps: Secondary | ICD-10-CM | POA: Diagnosis not present

## 2023-06-18 DIAGNOSIS — R1013 Epigastric pain: Secondary | ICD-10-CM

## 2023-06-18 DIAGNOSIS — K581 Irritable bowel syndrome with constipation: Secondary | ICD-10-CM

## 2023-06-18 DIAGNOSIS — K219 Gastro-esophageal reflux disease without esophagitis: Secondary | ICD-10-CM | POA: Diagnosis not present

## 2023-06-18 MED ORDER — LUBIPROSTONE 8 MCG PO CAPS: 8.0000 ug | ORAL_CAPSULE | Freq: Two times a day (BID) | ORAL | 3 refills | Status: AC

## 2023-06-18 MED ORDER — PANTOPRAZOLE SODIUM 40 MG PO TBEC: 40.0000 mg | DELAYED_RELEASE_TABLET | Freq: Every day | ORAL | 4 refills | Status: DC

## 2023-06-18 NOTE — Patient Instructions (Addendum)
_______________________________________________________  If your blood pressure at your visit was 140/90 or greater, please contact your primary care physician to follow up on this.  _______________________________________________________  If you are age 58 or older, your body mass index should be between 23-30. Your Body mass index is 35.88 kg/m. If this is out of the aforementioned range listed, please consider follow up with your Primary Care Provider.  If you are age 31 or younger, your body mass index should be between 19-25. Your Body mass index is 35.88 kg/m. If this is out of the aformentioned range listed, please consider follow up with your Primary Care Provider.   ________________________________________________________  The [facility] providers would like to encourage you to use [facility] to communicate with providers for non-urgent requests or questions.  Due to long hold times on the telephone, sending your provider a message by [facility] may be a faster and more efficient way to get a response.  Please allow 48 business hours for a response.  Please remember that this is for non-urgent requests.  _______________________________________________________  We have sent the following medications to your pharmacy for you to pick up at your convenience: Protonix. Stop nexium Amitiza 8mg  2 times a day with meals  Drink plenty of water and minimize NSAID's.  You have been scheduled for an endoscopy. Please follow written instructions given to you at your visit today. If you use inhalers (even only as needed), please bring them with you on the day of your procedure.  Repeat colonoscopy for 07-2024. Please call 2 months prior to schedule this. A letter will be sent as it gets closer.   Gastroesophageal Reflux Disease, Adult Gastroesophageal reflux (GER) happens when acid from the stomach flows up into the tube that connects the mouth and the stomach (esophagus). Normally, food travels  down the esophagus and stays in the stomach to be digested. However, when a person has GER, food and stomach acid sometimes move back up into the esophagus. If this becomes a more serious problem, the person may be diagnosed with a disease called gastroesophageal reflux disease (GERD). GERD occurs when the reflux: Happens often. Causes frequent or severe symptoms. Causes problems such as damage to the esophagus. When stomach acid comes in contact with the esophagus, the acid may cause inflammation in the esophagus. Over time, GERD may create small holes (ulcers) in the lining of the esophagus. What are the causes? This condition is caused by a problem with the muscle between the esophagus and the stomach (lower esophageal sphincter, or LES). Normally, the LES muscle closes after food passes through the esophagus to the stomach. When the LES is weakened or abnormal, it does not close properly, and that allows food and stomach acid to go back up into the esophagus. The LES can be weakened by certain dietary substances, medicines, and medical conditions, including: Tobacco use. Pregnancy. Having a hiatal hernia. Alcohol use. Certain foods and beverages, such as coffee, chocolate, onions, and peppermint. What increases the risk? You are more likely to develop this condition if you: Have an increased body weight. Have a connective tissue disorder. Take NSAIDs, such as ibuprofen. What are the signs or symptoms? Symptoms of this condition include: Heartburn. Difficult or painful swallowing and the feeling of having a lump in the throat. A bitter taste in the mouth. Bad breath and having a large amount of saliva. Having an upset or bloated stomach and belching. Chest pain. Different conditions can cause chest pain. Make sure you see your health  care provider if you experience chest pain. Shortness of breath or wheezing. Ongoing (chronic) cough or a nighttime cough. Wearing away of tooth  enamel. Weight loss. How is this diagnosed? This condition may be diagnosed based on a medical history and a physical exam. To determine if you have mild or severe GERD, your health care provider may also monitor how you respond to treatment. You may also have tests, including: A test to examine your stomach and esophagus with a small camera (endoscopy). A test that measures the acidity level in your esophagus. A test that measures how much pressure is on your esophagus. A barium swallow or modified barium swallow test to show the shape, size, and functioning of your esophagus. How is this treated? Treatment for this condition may vary depending on how severe your symptoms are. Your health care provider may recommend: Changes to your diet. Medicine. Surgery. The goal of treatment is to help relieve your symptoms and to prevent complications. Follow these instructions at home: Eating and drinking  Follow a diet as recommended by your health care provider. This may involve avoiding foods and drinks such as: Coffee and tea, with or without caffeine. Drinks that contain alcohol. Energy drinks and sports drinks. Carbonated drinks or sodas. Chocolate and cocoa. Peppermint and mint flavorings. Garlic and onions. Horseradish. Spicy and acidic foods, including peppers, chili powder, curry powder, vinegar, hot sauces, and barbecue sauce. Citrus fruit juices and citrus fruits, such as oranges, lemons, and limes. Tomato-based foods, such as red sauce, chili, salsa, and pizza with red sauce. Fried and fatty foods, such as donuts, french fries, potato chips, and high-fat dressings. High-fat meats, such as hot dogs and fatty cuts of red and white meats, such as rib eye steak, sausage, ham, and bacon. High-fat dairy items, such as whole milk, butter, and cream cheese. Eat small, frequent meals instead of large meals. Avoid drinking large amounts of liquid with your meals. Avoid eating meals during  the 2-3 hours before bedtime. Avoid lying down right after you eat. Do not exercise right after you eat. Lifestyle  Do not use any products that contain nicotine or tobacco. These products include cigarettes, chewing tobacco, and vaping devices, such as e-cigarettes. If you need help quitting, ask your health care provider. Try to reduce your stress by using methods such as yoga or meditation. If you need help reducing stress, ask your health care provider. If you are overweight, reduce your weight to an amount that is healthy for you. Ask your health care provider for guidance about a safe weight loss goal. General instructions Pay attention to any changes in your symptoms. Take over-the-counter and prescription medicines only as told by your health care provider. Do not take aspirin, ibuprofen, or other NSAIDs unless your health care provider told you to take these medicines. Wear loose-fitting clothing. Do not wear anything tight around your waist that causes pressure on your abdomen. Raise (elevate) the head of your bed about 6 inches (15 cm). You can use a wedge to do this. Avoid bending over if this makes your symptoms worse. Keep all follow-up visits. This is important. Contact a health care provider if: You have: New symptoms. Unexplained weight loss. Difficulty swallowing or it hurts to swallow. Wheezing or a persistent cough. A hoarse voice. Your symptoms do not improve with treatment. Get help right away if: You have sudden pain in your arms, neck, jaw, teeth, or back. You suddenly feel sweaty, dizzy, or light-headed. You have chest pain  or shortness of breath. You vomit and the vomit is green, yellow, or black, or it looks like blood or coffee grounds. You faint. You have stool that is red, bloody, or black. You cannot swallow, drink, or eat. These symptoms may represent a serious problem that is an emergency. Do not wait to see if the symptoms will go away. Get medical  help right away. Call your local emergency services (911 in the [place] Do not drive yourself to the hospital. Summary Gastroesophageal reflux happens when acid from the stomach flows up into the esophagus. GERD is a disease in which the reflux happens often, causes frequent or severe symptoms, or causes problems such as damage to the esophagus. Treatment for this condition may vary depending on how severe your symptoms are. Your health care provider may recommend diet and lifestyle changes, medicine, or surgery. Contact a health care provider if you have new or worsening symptoms. Take over-the-counter and prescription medicines only as told by your health care provider. Do not take aspirin, ibuprofen, or other NSAIDs unless your health care provider told you to do so. Keep all follow-up visits as told by your health care provider. This is important. This information is not intended to replace advice given to you by your health care provider. Make sure you discuss any questions you have with your health care provider. Document Revised: 06/24/2020 Document Reviewed: 06/24/2020 Elsevier Patient Education  2024 [employer].

## 2023-06-18 NOTE — Progress Notes (Signed)
Chief Complaint:   Referring Provider:  Eunice Blase, PA-C      ASSESSMENT AND PLAN;   #1. GERD with epi pain. S/P cholecystectomy in past  #2. IBS-C  #3. H/O polyps.  Colonoscopy due 07/2024  Plan: -Changed Nexium to protonix 40mg  po every day #90 -EGD -Check CBC, CMP, lipase at the time of EGD -Brochures GERD -Amitiza 8 mcg po BID with meals -Drink plenty of water -Minimize NSAIDs -If continued problems, CT Abdo/pelvis with contrast   HPI:    Erica Stephens is a 57 y.o. 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 Denies having any odynophagia or dysphagia No nausea or vomiting.  No fever chills or night sweats.  No jaundice dark urine or pale stools. She has taken ibuprofen recently after varicose vein surgery Has been on Nexium, still has breakthrough heartburn.  Unfortunately INS stopped covering Nexium  Wt Readings from Last 3 Encounters:  06/18/23 236 lb (107 kg)  08/22/19 242 lb (109.8 kg)  08/10/19 242 lb (109.8 kg)   Longstanding history of constipation with pellet-like stools, abdominal bloating, diagnosed with IBS BM 1/3 days No blood Has tried MiraLAX which initially worked and then stopped working.  Previous GI workup: EGD 12/15/2012 -Small HH -Incidental gastric polyps s/p polypectomy x 2.  Biopsies hyperplastic -Otherwise normal EGD.  Negative CLO test -Negative small bowel biopsies for celiac  Colonoscopy 08/22/2019 -Colonic polyp s/p polypectomy.  Biopsies tubular adenoma -Otherwise normal to TI. -Repeat in 5 years due to family history of colon polyps (mom), FH CRC GM  Also had EGD 09/2006 showing small HH, s/p esophageal dilatation 54 [address].  Negative esophageal biopsies for EOE/Barrett's     Past Medical History:  Diagnosis Date   Allergy    Anemia    Arthritis    Colon  polyps    Constipation    IBSC- uses Miralax PRN- was on Amitiza but off-    Gallstones    GERD (gastroesophageal reflux disease)    Hepatitis A    Hyperlipidemia    HDL elevated but no meds    IBS (irritable bowel syndrome)    with constipationj   Varicose vein of leg     Past Surgical History:  Procedure Laterality Date   APPENDECTOMY     BREAST BIOPSY Right 01/08/2023   MM RT BREAST BX W LOC DEV 1ST LESION IMAGE BX SPEC STEREO GUIDE 01/08/2023 [facility]   BREAST CYST ASPIRATION Right 2017   BREAST LUMPECTOMY Right    Indraductal papilloma   CHOLECYSTECTOMY     COLONOSCOPY     ESOPHAGOGASTRODUODENOSCOPY  12/15/2012   Small hiatal hernia. Incidental gastric polyps (status post polypectomy x2)  Otherwise normal EGD   POLYPECTOMY     WISDOM TOOTH EXTRACTION      Family History  Problem Relation Age of Onset   Breast cancer Mother    Colon polyps Mother    Heart attack Mother    Hypertension Mother    Diabetes Mother    Pancreatic cancer Father    Prostate cancer Father    Liver cancer Brother    Hemachromatosis Brother    Diabetes Brother    Stroke Brother    Heart attack Brother    Colon cancer Paternal Grandmother    Esophageal cancer Neg Hx    Stomach cancer Neg Hx  Rectal cancer Neg Hx     Social History   Tobacco Use   Smoking status: Former    Types: Cigarettes   Smokeless tobacco: Never   Tobacco comments:    High school years  Vaping Use   Vaping Use: Never used  Substance Use Topics   Alcohol use: Not Currently   Drug use: Not Currently    Current Outpatient Medications  Medication Sig Dispense Refill   esomeprazole (NEXIUM) 40 MG capsule Take 40 mg by mouth daily.     ketoconazole (NIZORAL) 2 % shampoo 1 Application as needed.     montelukast (SINGULAIR) 10 MG tablet Take 10 mg by mouth daily.     VITAMIN D PO Take 1 capsule by mouth daily.     No current facility-administered medications for this visit.    Allergies   Allergen Reactions   Latex Hives, Itching and Rash   Prednisone Swelling    Review of Systems:  Constitutional: Denies fever, chills, diaphoresis, appetite change and fatigue.  Occasional itching HEENT: Has allergies Respiratory: Denies SOB, DOE, cough, chest tightness,  and wheezing.   Cardiovascular: Denies chest pain, palpitations and leg swelling.  Genitourinary: Denies dysuria, urgency, frequency, hematuria, flank pain and difficulty urinating.  Musculoskeletal: Has myalgias, back pain, joint swelling, arthralgias and gait problem.  Has arthritis Skin: No rash.  Neurological: Denies dizziness, seizures, syncope, weakness, light-headedness, numbness and headaches.  Hematological: Denies adenopathy. Easy bruising, personal or family bleeding history  Psychiatric/Behavioral: No anxiety or depression     Physical Exam:    BP 110/70 (BP Location: Left Arm, Patient Position: Sitting, Cuff Size: Large)   Pulse 72   Ht 5\' 8"  (1.727 m)   Wt 236 lb (107 kg)   BMI 35.88 kg/m  Wt Readings from Last 3 Encounters:  06/18/23 236 lb (107 kg)  08/22/19 242 lb (109.8 kg)  08/10/19 242 lb (109.8 kg)   Constitutional:  Well-developed, in no acute distress. Psychiatric: Normal mood and affect. Behavior is normal. HEENT: Pupils normal.  Conjunctivae are normal. No scleral icterus. Neck supple.  Cardiovascular: Normal rate, regular rhythm. No edema Pulmonary/chest: Effort normal and breath sounds normal. No wheezing, rales or rhonchi. Abdominal: Soft, nondistended. Nontender. Bowel sounds active throughout. There are no masses palpable. No hepatomegaly. Rectal: Deferred Neurological: Alert and oriented to person place and time. Skin: Skin is warm and dry. No rashes noted.      Edman Circle, MD 06/18/2023, 11:20 AM  Cc: Eunice Blase, PA-C

## 2023-06-28 ENCOUNTER — Other Ambulatory Visit (HOSPITAL_COMMUNITY): Payer: Self-pay

## 2023-06-28 ENCOUNTER — Telehealth: Payer: Self-pay

## 2023-06-28 NOTE — Telephone Encounter (Signed)
*  Gastro  PA request received for Lubiprostone capsules  PA submitted to [employer] via CMM and has been APPROVED from 06/28/2023-06/27/2024  Key: [id number]

## 2023-07-23 ENCOUNTER — Encounter: Payer: Self-pay | Admitting: Gastroenterology

## 2023-07-30 ENCOUNTER — Other Ambulatory Visit (INDEPENDENT_AMBULATORY_CARE_PROVIDER_SITE_OTHER): Payer: 59

## 2023-07-30 ENCOUNTER — Encounter: Payer: Self-pay | Admitting: Gastroenterology

## 2023-07-30 ENCOUNTER — Ambulatory Visit (AMBULATORY_SURGERY_CENTER): Payer: 59 | Admitting: Gastroenterology

## 2023-07-30 VITALS — BP 119/66 | HR 67 | Temp 98.7°F | Resp 12 | Ht 68.0 in | Wt 236.0 lb

## 2023-07-30 DIAGNOSIS — K317 Polyp of stomach and duodenum: Secondary | ICD-10-CM | POA: Diagnosis present

## 2023-07-30 DIAGNOSIS — R1013 Epigastric pain: Secondary | ICD-10-CM

## 2023-07-30 DIAGNOSIS — K219 Gastro-esophageal reflux disease without esophagitis: Secondary | ICD-10-CM

## 2023-07-30 DIAGNOSIS — K2950 Unspecified chronic gastritis without bleeding: Secondary | ICD-10-CM

## 2023-07-30 LAB — CBC WITH DIFFERENTIAL/PLATELET
Basophils Absolute: 0.1 10*3/uL (ref 0.0–0.1)
Basophils Relative: 0.8 % (ref 0.0–3.0)
Eosinophils Absolute: 0.1 10*3/uL (ref 0.0–0.7)
Eosinophils Relative: 1.1 % (ref 0.0–5.0)
HCT: 42.1 % (ref 36.0–46.0)
Hemoglobin: 14.1 g/dL (ref 12.0–15.0)
Lymphocytes Relative: 38.8 % (ref 12.0–46.0)
Lymphs Abs: 2.9 10*3/uL (ref 0.7–4.0)
MCHC: 33.5 g/dL (ref 30.0–36.0)
MCV: 90.6 fl (ref 78.0–100.0)
Monocytes Absolute: 0.5 10*3/uL (ref 0.1–1.0)
Monocytes Relative: 6.3 % (ref 3.0–12.0)
Neutro Abs: 4 10*3/uL (ref 1.4–7.7)
Neutrophils Relative %: 53 % (ref 43.0–77.0)
Platelets: 351 10*3/uL (ref 150.0–400.0)
RBC: 4.65 Mil/uL (ref 3.87–5.11)
RDW: 13.3 % (ref 11.5–15.5)
WBC: 7.6 10*3/uL (ref 4.0–10.5)

## 2023-07-30 LAB — COMPREHENSIVE METABOLIC PANEL
ALT: 19 U/L (ref 0–35)
AST: 14 U/L (ref 0–37)
Albumin: 4.6 g/dL (ref 3.5–5.2)
Alkaline Phosphatase: 76 U/L (ref 39–117)
BUN: 12 mg/dL (ref 6–23)
CO2: 30 mEq/L (ref 19–32)
Calcium: 9.6 mg/dL (ref 8.4–10.5)
Chloride: 104 mEq/L (ref 96–112)
Creatinine, Ser: 0.57 mg/dL (ref 0.40–1.20)
GFR: 100.66 mL/min (ref >60.00)
Glucose, Bld: 86 mg/dL (ref 70–99)
Potassium: 3.9 mEq/L (ref 3.5–5.1)
Sodium: 141 mEq/L (ref 135–145)
Total Bilirubin: 1.1 mg/dL (ref 0.2–1.2)
Total Protein: 7.7 g/dL (ref 6.0–8.3)

## 2023-07-30 LAB — LIPASE: Lipase: 24 U/L (ref 11.0–59.0)

## 2023-07-30 MED ORDER — SODIUM CHLORIDE 0.9 % IV SOLN: 500.0000 mL | Freq: Once | INTRAVENOUS | Status: DC

## 2023-07-30 MED ORDER — LUBIPROSTONE 8 MCG PO CAPS
8.0000 ug | ORAL_CAPSULE | Freq: Two times a day (BID) | ORAL | 4 refills | Status: AC
Start: 2023-07-30 — End: ?

## 2023-07-30 NOTE — Patient Instructions (Addendum)
Recommendation:           - Patient has a contact number available for                            emergencies. The signs and symptoms of potential                            delayed complications were discussed with the                            patient. Return to normal activities tomorrow.                            Written discharge instructions were provided to the                            patient.                           - Resume previous diet.                           - Brochures regarding reflux                           - Continue present medications. Once better, can                            reduce Protonix to 40 mg every other day                           - Check CBC, CMP, amylase today as planned                           - For constipation, Amitiza 8 mcg p.o. BID #90, 4RF                           - Await pathology results.                           - The findings and recommendations were discussed                            with the patient's family.  Handout on GERD given.  YOU HAD AN ENDOSCOPIC PROCEDURE TODAY AT THE [facility]:   Refer to the procedure report that was given to you for any specific questions about what was found during the examination.  If the procedure report does not answer your questions, please call your gastroenterologist to clarify.  If you requested that your care partner not be given the details of your procedure findings, then the procedure report has been included in a sealed envelope for you to review at your convenience later.  YOU SHOULD EXPECT: Some feelings of bloating in the abdomen. Passage of more gas than usual.  Walking can help get rid of the air that was put into your GI tract during the  procedure and reduce the bloating. If you had a lower endoscopy (such as a colonoscopy or flexible sigmoidoscopy) you may notice spotting of blood in your stool or on the toilet paper. If you underwent a bowel prep for your procedure,  you may not have a normal bowel movement for a few days.  Please Note:  You might notice some irritation and congestion in your nose or some drainage.  This is from the oxygen used during your procedure.  There is no need for concern and it should clear up in a day or so.  SYMPTOMS TO REPORT IMMEDIATELY:  Following upper endoscopy (EGD)  Vomiting of blood or coffee ground material  New chest pain or pain under the shoulder blades  Painful or persistently difficult swallowing  New shortness of breath  Fever of 100F or higher  Black, tarry-looking stools  For urgent or emergent issues, a gastroenterologist can be reached at any hour by calling [phone]. Do not use MyChart messaging for urgent concerns.    DIET:  We do recommend a small meal at first, but then you may proceed to your regular diet.  Drink plenty of fluids but you should avoid alcoholic beverages for 24 hours.  ACTIVITY:  You should plan to take it easy for the rest of today and you should NOT DRIVE or use heavy machinery until tomorrow (because of the sedation medicines used during the test).    FOLLOW UP: Our staff will call the number listed on your records the next business day following your procedure.  We will call around 7:15- 8:00 am to check on you and address any questions or concerns that you may have regarding the information given to you following your procedure. If we do not reach you, we will leave a message.     If any biopsies were taken you will be contacted by phone or by letter within the next 1-3 weeks.  Please call us at [phone] if you have not heard about the biopsies in 3 weeks.    SIGNATURES/CONFIDENTIALITY: You and/or your care partner have signed paperwork which will be entered into your electronic medical record.  These signatures attest to the fact that that the information above on your After Visit Summary has been reviewed and is understood.  Full responsibility of the  confidentiality of this discharge information lies with you and/or your care-partner.

## 2023-07-30 NOTE — Progress Notes (Signed)
Chief Complaint:   Referring Provider:  Eunice Blase, PA-C      ASSESSMENT AND PLAN;   #1. GERD with epi pain. S/P cholecystectomy in past  #2. IBS-C  #3. H/O polyps.  Colonoscopy due 07/2024  Plan: -Changed Nexium to protonix 40mg  po every day #90 -EGD -Check CBC, CMP, lipase at the time of EGD -Brochures GERD -Amitiza 8 mcg po BID with meals -Drink plenty of water -Minimize NSAIDs -If continued problems, CT Abdo/pelvis with contrast   HPI:    Erica Stephens is a 57 y.o. 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 Denies having any odynophagia or dysphagia No nausea or vomiting.  No fever chills or night sweats.  No jaundice dark urine or pale stools. She has taken ibuprofen recently after varicose vein surgery Has been on Nexium, still has breakthrough heartburn.  Unfortunately INS stopped covering Nexium  Wt Readings from Last 3 Encounters:  07/30/23 236 lb (107 kg)  06/18/23 236 lb (107 kg)  08/22/19 242 lb (109.8 kg)   Longstanding history of constipation with pellet-like stools, abdominal bloating, diagnosed with IBS BM 1/3 days No blood Has tried MiraLAX which initially worked and then stopped working.  Previous GI workup: EGD 12/15/2012 -Small HH -Incidental gastric polyps s/p polypectomy x 2.  Biopsies hyperplastic -Otherwise normal EGD.  Negative CLO test -Negative small bowel biopsies for celiac  Colonoscopy 08/22/2019 -Colonic polyp s/p polypectomy.  Biopsies tubular adenoma -Otherwise normal to TI. -Repeat in 5 years due to family history of colon polyps (mom), FH CRC GM  Also had EGD 09/2006 showing small HH, s/p esophageal dilatation 54 [address].  Negative esophageal biopsies for EOE/Barrett's     Past Medical History:  Diagnosis Date   Allergy    Anemia    Arthritis    Colon  polyps    Constipation    IBSC- uses Miralax PRN- was on Amitiza but off-    Gallstones    GERD (gastroesophageal reflux disease)    Hepatitis A    Hyperlipidemia    HDL elevated but no meds    IBS (irritable bowel syndrome)    with constipationj   Varicose vein of leg     Past Surgical History:  Procedure Laterality Date   APPENDECTOMY     BREAST BIOPSY Right 01/08/2023   MM RT BREAST BX W LOC DEV 1ST LESION IMAGE BX SPEC STEREO GUIDE 01/08/2023 [facility]   BREAST CYST ASPIRATION Right 2017   BREAST LUMPECTOMY Right    Indraductal papilloma   CHOLECYSTECTOMY     COLONOSCOPY     ESOPHAGOGASTRODUODENOSCOPY  12/15/2012   Small hiatal hernia. Incidental gastric polyps (status post polypectomy x2)  Otherwise normal EGD   POLYPECTOMY     WISDOM TOOTH EXTRACTION      Family History  Problem Relation Age of Onset   Breast cancer Mother    Colon polyps Mother    Heart attack Mother    Hypertension Mother    Diabetes Mother    Pancreatic cancer Father    Prostate cancer Father    Liver cancer Brother    Hemachromatosis Brother    Diabetes Brother    Stroke Brother    Heart attack Brother    Colon cancer Paternal Grandmother    Esophageal cancer Neg Hx    Stomach cancer Neg Hx  Rectal cancer Neg Hx     Social History   Tobacco Use   Smoking status: Former    Types: Cigarettes   Smokeless tobacco: Never   Tobacco comments:    High school years  Vaping Use   Vaping status: Never Used  Substance Use Topics   Alcohol use: Not Currently   Drug use: Not Currently    Current Outpatient Medications  Medication Sig Dispense Refill   montelukast (SINGULAIR) 10 MG tablet Take 10 mg by mouth daily.     pantoprazole (PROTONIX) 40 MG tablet Take 1 tablet (40 mg total) by mouth daily. 90 tablet 4   ketoconazole (NIZORAL) 2 % shampoo 1 Application as needed.     lubiprostone (AMITIZA) 8 MCG capsule Take 1 capsule (8 mcg total) by mouth 2 (two) times daily with a  meal. 180 capsule 3   VITAMIN D PO Take 1 capsule by mouth daily.     Current Facility-Administered Medications  Medication Dose Route Frequency Provider Last Rate Last Admin   0.9 %  sodium chloride infusion  500 mL Intravenous Once Lynann Bologna, MD        Allergies  Allergen Reactions   Latex Hives, Itching and Rash   Prednisone Swelling    Review of Systems:  Constitutional: Denies fever, chills, diaphoresis, appetite change and fatigue.  Occasional itching HEENT: Has allergies Respiratory: Denies SOB, DOE, cough, chest tightness,  and wheezing.   Cardiovascular: Denies chest pain, palpitations and leg swelling.  Genitourinary: Denies dysuria, urgency, frequency, hematuria, flank pain and difficulty urinating.  Musculoskeletal: Has myalgias, back pain, joint swelling, arthralgias and gait problem.  Has arthritis Skin: No rash.  Neurological: Denies dizziness, seizures, syncope, weakness, light-headedness, numbness and headaches.  Hematological: Denies adenopathy. Easy bruising, personal or family bleeding history  Psychiatric/Behavioral: No anxiety or depression     Physical Exam:    BP (!) 151/75   Pulse 71   Temp 98.7 F (37.1 C)   Ht 5\' 8"  (1.727 m)   Wt 236 lb (107 kg)   SpO2 97%   BMI 35.88 kg/m  Wt Readings from Last 3 Encounters:  07/30/23 236 lb (107 kg)  06/18/23 236 lb (107 kg)  08/22/19 242 lb (109.8 kg)   Constitutional:  Well-developed, in no acute distress. Psychiatric: Normal mood and affect. Behavior is normal. HEENT: Pupils normal.  Conjunctivae are normal. No scleral icterus. Neck supple.  Cardiovascular: Normal rate, regular rhythm. No edema Pulmonary/chest: Effort normal and breath sounds normal. No wheezing, rales or rhonchi. Abdominal: Soft, nondistended. Nontender. Bowel sounds active throughout. There are no masses palpable. No hepatomegaly. Rectal: Deferred Neurological: Alert and oriented to person place and time. Skin: Skin is warm  and dry. No rashes noted.      Edman Circle, MD 07/30/2023, 10:02 AM  Cc: Eunice Blase, PA-C

## 2023-07-30 NOTE — Op Note (Signed)
[facility] Patient Name: Erica Stephens Procedure Date: 07/30/2023 10:02 AM MRN: [id number] Endoscopist: Lynann Bologna , MD, [id number] Age: 57 Referring MD:  Date of Birth: Mar 14, 1966 Gender: Female Account #: [id number] Procedure:                Upper GI endoscopy Indications:              Epigastric abdominal pain Medicines:                Monitored Anesthesia Care Procedure:                Pre-Anesthesia Assessment:                           - Prior to the procedure, a History and Physical                            was performed, and patient medications and                            allergies were reviewed. The patient's tolerance of                            previous anesthesia was also reviewed. The risks                            and benefits of the procedure and the sedation                            options and risks were discussed with the patient.                            All questions were answered, and informed consent                            was obtained. Prior Anticoagulants: The patient has                            taken no anticoagulant or antiplatelet agents. ASA                            Grade Assessment: II - A patient with mild systemic                            disease. After reviewing the risks and benefits,                            the patient was deemed in satisfactory condition to                            undergo the procedure.                           After obtaining informed consent, the endoscope was  passed under direct vision. Throughout the                            procedure, the patient's blood pressure, pulse, and                            oxygen saturations were monitored continuously. The                            Olympus Scope [id number] was introduced through the                            mouth, and advanced to the second part of duodenum.                            The upper GI endoscopy  was accomplished without                            difficulty. The patient tolerated the procedure                            well. Scope In: Scope Out: Findings:                 The examined esophagus was normal. Biopsies were                            obtained from the proximal and distal esophagus                            with cold forceps for histology of suspected                            eosinophilic esophagitis.                           The Z-line was regular and was found 36 cm from the                            incisors.                           A small hiatal hernia was present.                           Multiple (15-20) 4 to 6 mm sessile polyps with no                            bleeding and no stigmata of recent bleeding were                            found in the gastric fundus and in the gastric                            body. Three polyps were removed  with a cold snare.                            Resection and retrieval were complete. Multiple                            biopsies were obtained from the body and antrum to                            rule out HP.                           The examined duodenum was normal. Complications:            No immediate complications. Estimated Blood Loss:     Estimated blood loss: none. Impression:               - Small hiatal hernia.                           - Multiple gastric polyps. Resected and retrieved x                            3. Recommendation:           - Patient has a contact number available for                            emergencies. The signs and symptoms of potential                            delayed complications were discussed with the                            patient. Return to normal activities tomorrow.                            Written discharge instructions were provided to the                            patient.                           - Resume previous diet.                           -  Brochures regarding reflux                           - Continue present medications. Once better, can                            reduce Protonix to 40 mg every other day                           - Check CBC, CMP, amylase today as planned                           -  For constipation, Amitiza 8 mcg p.o. BID #90, 4RF                           - Await pathology results.                           - The findings and recommendations were discussed                            with the patient's family. Lynann Bologna, MD 07/30/2023 10:21:36 AM This report has been signed electronically.

## 2023-07-30 NOTE — Progress Notes (Signed)
Uneventful anesthetic. Report to pacu rn. Vss. Care resumed by rn. 

## 2023-08-02 ENCOUNTER — Telehealth: Payer: Self-pay | Admitting: *Deleted

## 2023-08-02 NOTE — Telephone Encounter (Signed)
1 

## 2023-08-06 ENCOUNTER — Encounter: Payer: Self-pay | Admitting: Gastroenterology

## 2023-10-01 ENCOUNTER — Other Ambulatory Visit: Payer: Self-pay | Admitting: Surgery

## 2023-10-01 DIAGNOSIS — R9389 Abnormal findings on diagnostic imaging of other specified body structures: Secondary | ICD-10-CM

## 2023-10-04 ENCOUNTER — Other Ambulatory Visit: Payer: Self-pay | Admitting: Surgery

## 2023-10-04 DIAGNOSIS — D241 Benign neoplasm of right breast: Secondary | ICD-10-CM

## 2023-10-07 ENCOUNTER — Other Ambulatory Visit: Payer: Self-pay | Admitting: Surgery

## 2023-10-07 DIAGNOSIS — Z1231 Encounter for screening mammogram for malignant neoplasm of breast: Secondary | ICD-10-CM

## 2023-11-14 ENCOUNTER — Ambulatory Visit
Admission: RE | Admit: 2023-11-14 | Discharge: 2023-11-14 | Disposition: A | Discharge status: Home / Self Care | Payer: 59 | Source: Ambulatory Visit | Attending: Surgery | Admitting: Surgery

## 2023-11-14 DIAGNOSIS — R9389 Abnormal findings on diagnostic imaging of other specified body structures: Secondary | ICD-10-CM

## 2023-11-14 MED ORDER — GADOPICLENOL 0.5 MMOL/ML IV SOLN
10.0000 mL | Freq: Once | INTRAVENOUS | Status: AC | PRN
  Administered 2023-11-14: 10 mL via INTRAVENOUS

## 2023-12-03 ENCOUNTER — Ambulatory Visit
Admission: RE | Admit: 2023-12-03 | Discharge: 2023-12-03 | Disposition: A | Discharge status: Home / Self Care | Payer: 59 | Source: Ambulatory Visit | Attending: Surgery | Admitting: Surgery

## 2023-12-03 DIAGNOSIS — Z1231 Encounter for screening mammogram for malignant neoplasm of breast: Secondary | ICD-10-CM

## 2023-12-07 ENCOUNTER — Other Ambulatory Visit: Payer: Self-pay | Admitting: Surgery

## 2023-12-07 DIAGNOSIS — Z0000 Encounter for general adult medical examination without abnormal findings: Secondary | ICD-10-CM

## 2024-03-30 ENCOUNTER — Other Ambulatory Visit: Payer: Self-pay | Admitting: Surgery

## 2024-03-30 DIAGNOSIS — R928 Other abnormal and inconclusive findings on diagnostic imaging of breast: Secondary | ICD-10-CM

## 2024-03-30 DIAGNOSIS — N6342 Unspecified lump in left breast, subareolar: Secondary | ICD-10-CM

## 2024-05-25 ENCOUNTER — Encounter: Payer: Self-pay | Admitting: Gastroenterology

## 2024-05-26 ENCOUNTER — Ambulatory Visit
Admission: RE | Admit: 2024-05-26 | Discharge: 2024-05-26 | Disposition: A | Discharge status: Home / Self Care | Payer: Self-pay | Source: Ambulatory Visit | Attending: Surgery | Admitting: Surgery

## 2024-05-26 DIAGNOSIS — R928 Other abnormal and inconclusive findings on diagnostic imaging of breast: Secondary | ICD-10-CM

## 2024-05-26 DIAGNOSIS — N6342 Unspecified lump in left breast, subareolar: Secondary | ICD-10-CM

## 2024-05-26 MED ORDER — GADOPICLENOL 0.5 MMOL/ML IV SOLN
8.0000 mL | Freq: Once | INTRAVENOUS | Status: AC | PRN
  Administered 2024-05-26: 8 mL via INTRAVENOUS

## 2024-07-10 ENCOUNTER — Other Ambulatory Visit: Payer: Self-pay

## 2024-07-10 MED ORDER — PANTOPRAZOLE SODIUM 40 MG PO TBEC: 40.0000 mg | DELAYED_RELEASE_TABLET | Freq: Every day | ORAL | 1 refills | Status: AC

## 2024-09-20 ENCOUNTER — Encounter: Payer: Self-pay | Admitting: Gastroenterology

## 2024-10-09 ENCOUNTER — Encounter: Payer: Self-pay | Admitting: Gastroenterology

## 2024-10-19 ENCOUNTER — Other Ambulatory Visit: Payer: Self-pay | Admitting: Surgery

## 2024-10-19 DIAGNOSIS — Z0000 Encounter for general adult medical examination without abnormal findings: Secondary | ICD-10-CM

## 2024-11-10 ENCOUNTER — Other Ambulatory Visit: Payer: Self-pay

## 2024-11-10 ENCOUNTER — Ambulatory Visit

## 2024-11-10 VITALS — Ht 69.0 in | Wt 189.5 lb

## 2024-11-10 DIAGNOSIS — Z860100 Personal history of colon polyps, unspecified: Secondary | ICD-10-CM

## 2024-11-10 MED ORDER — NA SULFATE-K SULFATE-MG SULF 17.5-3.13-1.6 GM/177ML PO SOLN: 1.0000 | Freq: Once | ORAL | 0 refills | Status: AC

## 2024-11-10 NOTE — Progress Notes (Signed)
 Denies allergies to eggs or soy products. Denies complication of anesthesia or sedation. Denies use of weight loss medication. Denies use of O2.   Emmi instructions given for colonoscopy.

## 2024-11-28 ENCOUNTER — Encounter: Payer: Self-pay | Admitting: Gastroenterology

## 2024-11-30 ENCOUNTER — Telehealth: Payer: Self-pay | Admitting: Gastroenterology

## 2024-11-30 NOTE — Telephone Encounter (Signed)
 Patient called stating that she is scheduled to have a colonoscopy tomorrow. She states she needs to speak with someone regarding the CPT code. Requesting a call back. Please advise.

## 2024-12-01 ENCOUNTER — Encounter: Payer: Self-pay | Admitting: Gastroenterology

## 2024-12-01 ENCOUNTER — Ambulatory Visit: Admitting: Gastroenterology

## 2024-12-01 VITALS — BP 155/69 | HR 64 | Temp 97.8°F | Resp 12 | Ht 68.0 in | Wt 189.5 lb

## 2024-12-01 DIAGNOSIS — Z800 Family history of malignant neoplasm of digestive organs: Secondary | ICD-10-CM

## 2024-12-01 DIAGNOSIS — Z83719 Family history of colon polyps, unspecified: Secondary | ICD-10-CM | POA: Diagnosis not present

## 2024-12-01 DIAGNOSIS — Q439 Congenital malformation of intestine, unspecified: Secondary | ICD-10-CM | POA: Diagnosis not present

## 2024-12-01 DIAGNOSIS — Z1211 Encounter for screening for malignant neoplasm of colon: Secondary | ICD-10-CM

## 2024-12-01 DIAGNOSIS — Z860100 Personal history of colon polyps, unspecified: Secondary | ICD-10-CM

## 2024-12-01 DIAGNOSIS — K581 Irritable bowel syndrome with constipation: Secondary | ICD-10-CM

## 2024-12-01 DIAGNOSIS — K6389 Other specified diseases of intestine: Secondary | ICD-10-CM

## 2024-12-01 DIAGNOSIS — K640 First degree hemorrhoids: Secondary | ICD-10-CM

## 2024-12-01 MED ORDER — SODIUM CHLORIDE 0.9 % IV SOLN: 500.0000 mL | Freq: Once | INTRAVENOUS | Status: AC

## 2024-12-01 NOTE — Op Note (Signed)
 [facility] Patient Name: Erica Stephens Procedure Date: 12/01/2024 9:58 AM MRN: [id number] Endoscopist: Lynnie Bring , MD, [id number] Age: 58 Referring MD:  Date of Birth: 1966-07-23 Gender: Female Account #: [id number] Procedure:                Colonoscopy Indications:              Colon cancer screening in patient at increased                            risk: Family history of colonic polyps ( mom) .                            Family history of colon cancer in a second- degree                            relative ( grand mother). H/O polyps. Medicines:                Monitored Anesthesia Care Procedure:                Pre-Anesthesia Assessment:                           - Prior to the procedure, a History and Physical                            was performed, and patient medications and                            allergies were reviewed. The patient's tolerance of                            previous anesthesia was also reviewed. The risks                            and benefits of the procedure and the sedation                            options and risks were discussed with the patient.                            All questions were answered, and informed consent                            was obtained. Prior Anticoagulants: The patient has                            taken no anticoagulant or antiplatelet agents. ASA                            Grade Assessment: II - A patient with mild systemic                            disease. After reviewing the risks and benefits,  the patient was deemed in satisfactory condition to                            undergo the procedure.                           After obtaining informed consent, the colonoscope                            was passed under direct vision. Throughout the                            procedure, the patient's blood pressure, pulse, and                            oxygen saturations were  monitored continuously. The                            Colonoscope CFwas introduced through the anus and                            advanced to the 2 cm into the ileum. The                            colonoscopy was performed without difficulty. The                            patient tolerated the procedure well. The quality                            of the bowel preparation was adequate to identify                            polyps. The terminal ileum, ileocecal valve,                            appendiceal orifice, and rectum were photographed. Scope In: 10:07:18 AM Scope Out: 10:22:22 AM Scope Withdrawal Time: 0 hours 8 minutes 33 seconds  Total Procedure Duration: 0 hours 15 minutes 4 seconds  Findings:                 A diffuse area of mild melanosis was found in the                            entire colon.                           Non-bleeding internal hemorrhoids were found during                            retroflexion. The hemorrhoids were small and Grade                            I (internal hemorrhoids that do not prolapse).  The terminal ileum appeared normal.                           Retroflexion in the right colon was performed.                           The exam was otherwise without abnormality on                            direct and retroflexion views. The colon was                            somewhat torturous. Complications:            No immediate complications. Estimated Blood Loss:     Estimated blood loss: none. Impression:               - Mild Melanosis in the colon.                           - Non-bleeding internal hemorrhoids.                           - The examined portion of the ileum was normal.                           - The examination was otherwise normal on direct                            and retroflexion views.                           - No specimens collected. Recommendation:           - Patient has a contact number  available for                            emergencies. The signs and symptoms of potential                            delayed complications were discussed with the                            patient. Return to normal activities tomorrow.                            Written discharge instructions were provided to the                            patient.                           - Resume previous diet.                           - Continue present medications.                           -  MiraLAX 17 g p.o. daily if with constipation.                            Wean off herbal laxatives.                           - Repeat colonoscopy in 10 years for screening                            purposes. Earlier, with any new problems or change                            in family history.                           - The findings and recommendations were discussed                            with the patient's family. Lynnie Bring, MD 12/01/2024 10:27:54 AM This report has been signed electronically.

## 2024-12-01 NOTE — Progress Notes (Signed)
 Report to PACU, RN, vss, BBS= Clear.

## 2024-12-01 NOTE — Progress Notes (Signed)
 [facility] History and Physical   Primary Care Physician:  Venancio Pock, PA-C   Reason for Procedure:    history of polyps, family history of colon polyps, family history of colon cancer in second-degree relative.  Plan:    colon   The patient was provided an opportunity to ask questions and all were answered. The patient agreed with the plan.   HPI: Erica Stephens is a 58 y.o. female    Past Medical History:  Diagnosis Date   Allergy    Anemia    Arthritis    Colon polyps    Constipation    IBSC- uses Miralax PRN- was on Amitiza  but off-    Gallstones    GERD (gastroesophageal reflux disease)    Hepatitis A    Hyperlipidemia    HDL elevated but no meds    IBS (irritable bowel syndrome)    with constipationj   Varicose vein of leg     Past Surgical History:  Procedure Laterality Date   APPENDECTOMY     BREAST BIOPSY Right 01/08/2023   MM RT BREAST BX W LOC DEV 1ST LESION IMAGE BX SPEC STEREO GUIDE 01/08/2023 [facility]   BREAST BIOPSY Left    BREAST CYST ASPIRATION Right 2017   BREAST EXCISIONAL BIOPSY Right    BREAST LUMPECTOMY Right    Indraductal papilloma   CHOLECYSTECTOMY     COLONOSCOPY     ESOPHAGOGASTRODUODENOSCOPY  12/15/2012   Small hiatal hernia. Incidental gastric polyps (status post polypectomy x2)  Otherwise normal EGD   HYSTEROSCOPY     POLYPECTOMY     WISDOM TOOTH EXTRACTION      Prior to Admission medications   Medication Sig Start Date End Date Taking? Authorizing Provider  montelukast (SINGULAIR) 10 MG tablet Take 10 mg by mouth daily. 08/03/19  Yes [provider]  NON FORMULARY Use as directed 0.5 mLs in the mouth or throat in the morning and at bedtime. Lipotropic Amino Acid drops   Yes [provider]  OVER THE COUNTER MEDICATION Air bourne one gummy daily.   Yes [provider]  OVER THE COUNTER MEDICATION Vitamin C gummy one daily.   Yes [provider]  pantoprazole  (PROTONIX )  40 MG tablet Take 1 tablet (40 mg total) by mouth daily. 07/10/24  Yes Charlanne Groom, MD  VITAMIN D PO Take 1 capsule by mouth daily.   Yes [provider]  ketoconazole (NIZORAL) 2 % shampoo 1 Application as needed. 05/09/19   [provider]  lubiprostone  (AMITIZA ) 8 MCG capsule Take 1 capsule (8 mcg total) by mouth 2 (two) times daily with a meal. Patient not taking: Reported on 11/10/2024 06/18/23   Charlanne Groom, MD  lubiprostone  (AMITIZA ) 8 MCG capsule Take 1 capsule (8 mcg total) by mouth 2 (two) times daily with a meal. Patient not taking: No sig reported 07/30/23   Charlanne Groom, MD    Current Outpatient Medications  Medication Sig Dispense Refill   montelukast (SINGULAIR) 10 MG tablet Take 10 mg by mouth daily.     NON FORMULARY Use as directed 0.5 mLs in the mouth or throat in the morning and at bedtime. Lipotropic Amino Acid drops     OVER THE COUNTER MEDICATION Air bourne one gummy daily.     OVER THE COUNTER MEDICATION Vitamin C gummy one daily.     pantoprazole  (PROTONIX ) 40 MG tablet Take 1 tablet (40 mg total) by mouth daily. 90 tablet 1   VITAMIN D  PO Take 1 capsule by mouth daily.     ketoconazole (NIZORAL) 2 % shampoo 1 Application as needed.     lubiprostone  (AMITIZA ) 8 MCG capsule Take 1 capsule (8 mcg total) by mouth 2 (two) times daily with a meal. (Patient not taking: Reported on 11/10/2024) 180 capsule 3   lubiprostone  (AMITIZA ) 8 MCG capsule Take 1 capsule (8 mcg total) by mouth 2 (two) times daily with a meal. (Patient not taking: No sig reported) 90 capsule 4   Current Facility-Administered Medications  Medication Dose Route Frequency Provider Last Rate Last Admin   0.9 %  sodium chloride  infusion  500 mL Intravenous Once Charlanne Groom, MD        Allergies as of 12/01/2024 - Review Complete 12/01/2024  Allergen Reaction Noted   Latex Hives, Itching, and Rash 09/19/2020   Prednisone Swelling 06/22/2016    Family History  Problem Relation Age  of Onset   Breast cancer Mother 18 - 22   Colon polyps Mother    Heart attack Mother    Hypertension Mother    Diabetes Mother    Pancreatic cancer Father    Prostate cancer Father    Colon cancer Paternal Grandmother    Liver cancer Brother    Hemachromatosis Brother    Diabetes Brother    Stroke Brother    Heart attack Brother    Esophageal cancer Neg Hx    Stomach cancer Neg Hx    Rectal cancer Neg Hx     Social History   Socioeconomic History   Marital status: Married    Spouse name: Not on file   Number of children: 0   Years of education: Not on file   Highest education level: Not on file  Occupational History   Occupation: Film/video Editor  Tobacco Use   Smoking status: Former    Types: Cigarettes   Smokeless tobacco: Never   Tobacco comments:    High school years  Vaping Use   Vaping status: Never Used  Substance and Sexual Activity   Alcohol use: Yes    Comment: Rare   Drug use: Not Currently   Sexual activity: Not on file  Other Topics Concern   Not on file  Social History Narrative   Not on file   Social Drivers of Health   Financial Resource Strain: Not on file  Food Insecurity: Not on file  Transportation Needs: Not on file  Physical Activity: Not on file  Stress: Not on file  Social Connections: Not on file  Intimate Partner Violence: Not on file    Review of Systems: Positive for none All other review of systems negative except as mentioned in the HPI.  Physical Exam: Vital signs in last 24 hours: @VSRANGES @   General:   Alert,  Well-developed, well-nourished, pleasant and cooperative in NAD Lungs:  Clear throughout to auscultation.   Heart:  Regular rate and rhythm; no murmurs, clicks, rubs,  or gallops. Abdomen:  Soft, nontender and nondistended. Normal bowel sounds.   Neuro/Psych:  Alert and cooperative. Normal mood and affect. A and O x 3    No significant changes were identified.  The patient continues to be an  appropriate candidate for the planned procedure and anesthesia.   Anselm Charlanne, MD. [facility] 12/01/2024 3:13 PM@

## 2024-12-01 NOTE — Patient Instructions (Addendum)
 YOU HAD AN ENDOSCOPIC PROCEDURE TODAY AT THE [facility]:   Refer to the procedure report that was given to you for any specific questions about what was found during the examination.  If the procedure report does not answer your questions, please call your gastroenterologist to clarify.  If you requested that your care partner not be given the details of your procedure findings, then the procedure report has been included in a sealed envelope for you to review at your convenience later.  YOU SHOULD EXPECT: Some feelings of bloating in the abdomen. Passage of more gas than usual.  Walking can help get rid of the air that was put into your GI tract during the procedure and reduce the bloating. If you had a lower endoscopy (such as a colonoscopy or flexible sigmoidoscopy) you may notice spotting of blood in your stool or on the toilet paper. If you underwent a bowel prep for your procedure, you may not have a normal bowel movement for a few days.  Please Note:  You might notice some irritation and congestion in your nose or some drainage.  This is from the oxygen used during your procedure.  There is no need for concern and it should clear up in a day or so.  SYMPTOMS TO REPORT IMMEDIATELY:   Following lower endoscopy (colonoscopy or flexible sigmoidoscopy):  Excessive amounts of blood in the stool  Significant tenderness or worsening of abdominal pains  Swelling of the abdomen that is new, acute  Fever of 100F or higher   For urgent or emergent issues, a gastroenterologist can be reached at any hour by calling [phone]. Do not use MyChart messaging for urgent concerns.    DIET:  We do recommend a small meal at first, but then you may proceed to your regular diet.  Drink plenty of fluids but you should avoid alcoholic beverages for 24 hours.  MEDICATIONS: Continue present medications. MiraLAX 17 gram by mouth daily if you experience constipation. Wean off the herbal  laxatives.  FOLLOW UP: Repeat colonoscopy in 10 years for screening purposes. Earlier, with any new problems or change in family history.  Thank you for allowing us  to provide for your healthcare needs today.  ACTIVITY:  You should plan to take it easy for the rest of today and you should NOT DRIVE or use heavy machinery until tomorrow (because of the sedation medicines used during the test).    FOLLOW UP: Our staff will call the number listed on your records the next business day following your procedure.  We will call around 7:15- 8:00 am to check on you and address any questions or concerns that you may have regarding the information given to you following your procedure. If we do not reach you, we will leave a message.     If any biopsies were taken you will be contacted by phone or by letter within the next 1-3 weeks.  Please call us  at [phone] if you have not heard about the biopsies in 3 weeks.    SIGNATURES/CONFIDENTIALITY: You and/or your care partner have signed paperwork which will be entered into your electronic medical record.  These signatures attest to the fact that that the information above on your After Visit Summary has been reviewed and is understood.  Full responsibility of the confidentiality of this discharge information lies with you and/or your care-partner.

## 2024-12-01 NOTE — Progress Notes (Signed)
Pt. states no medical or surgical changes since previsit or office visit. 

## 2024-12-04 ENCOUNTER — Ambulatory Visit: Payer: 59

## 2024-12-04 ENCOUNTER — Telehealth: Payer: Self-pay

## 2024-12-04 NOTE — Telephone Encounter (Signed)
  Follow up Call-     12/01/2024    9:25 AM 07/30/2023    9:15 AM  Call back number  Post procedure Call Back phone  # [phone]  Permission to leave phone message Yes Yes     Patient questions:  Do you have a fever, pain , or abdominal swelling? No. Pain Score  0 *  Have you tolerated food without any problems? Yes.    Have you been able to return to your normal activities? Yes.    Do you have any questions about your discharge instructions: Diet   No. Medications  No. Follow up visit  No.  Do you have questions or concerns about your Care? No.  Actions: * If pain score is 4 or above: No action needed, pain <4.

## 2024-12-08 ENCOUNTER — Ambulatory Visit
Admission: RE | Admit: 2024-12-08 | Discharge: 2024-12-08 | Disposition: A | Source: Ambulatory Visit | Attending: Surgery

## 2024-12-08 DIAGNOSIS — Z0000 Encounter for general adult medical examination without abnormal findings: Secondary | ICD-10-CM

## 2024-12-14 ENCOUNTER — Other Ambulatory Visit: Payer: Self-pay | Admitting: Surgery

## 2024-12-14 DIAGNOSIS — Z1231 Encounter for screening mammogram for malignant neoplasm of breast: Secondary | ICD-10-CM

## 2025-01-23 ENCOUNTER — Other Ambulatory Visit: Payer: Self-pay | Admitting: Gastroenterology

## 2025-12-10 ENCOUNTER — Ambulatory Visit
